# Patient Record
Sex: Female | Born: 1965 | Race: White | Hispanic: No | Smoking: Current every day smoker
Health system: Southern US, Community
[De-identification: ages and names within clinical notes are randomized; demographics above are authoritative.]

## PROBLEM LIST (undated history)

## (undated) DIAGNOSIS — F32A Depression, unspecified: Secondary | ICD-10-CM

## (undated) DIAGNOSIS — F329 Major depressive disorder, single episode, unspecified: Secondary | ICD-10-CM

## (undated) HISTORY — DX: Depression, unspecified: F32.A

## (undated) HISTORY — DX: Major depressive disorder, single episode, unspecified: F32.9

---

## 2007-12-22 ENCOUNTER — Emergency Department (HOSPITAL_COMMUNITY): Admission: EM | Admit: 2007-12-22 | Discharge: 2007-12-22 | Payer: Self-pay | Admitting: Emergency Medicine

## 2008-05-02 ENCOUNTER — Ambulatory Visit: Payer: Self-pay | Admitting: Cardiology

## 2010-09-26 ENCOUNTER — Ambulatory Visit (HOSPITAL_COMMUNITY): Payer: Self-pay | Admitting: Psychiatry

## 2010-10-08 ENCOUNTER — Ambulatory Visit (HOSPITAL_COMMUNITY): Payer: Self-pay | Admitting: Psychiatry

## 2010-11-01 ENCOUNTER — Ambulatory Visit (HOSPITAL_COMMUNITY): Payer: Self-pay | Admitting: Psychiatry

## 2010-11-22 ENCOUNTER — Ambulatory Visit (HOSPITAL_COMMUNITY): Payer: Self-pay | Admitting: Psychiatry

## 2010-11-28 ENCOUNTER — Ambulatory Visit (HOSPITAL_COMMUNITY): Admit: 2010-11-28 | Payer: Self-pay | Admitting: Psychiatry

## 2010-12-18 ENCOUNTER — Ambulatory Visit (HOSPITAL_COMMUNITY): Admit: 2010-12-18 | Payer: Self-pay | Admitting: Psychiatry

## 2011-08-15 LAB — DIFFERENTIAL
Eosinophils Absolute: 0.2
Eosinophils Relative: 2
Lymphs Abs: 2.5
Monocytes Relative: 6

## 2011-08-15 LAB — CBC
HCT: 41.3
Hemoglobin: 14
MCHC: 34
MCV: 91.3
Platelets: 328
RBC: 4.51
RDW: 13
WBC: 11.5 — ABNORMAL HIGH

## 2011-08-15 LAB — POCT CARDIAC MARKERS
Operator id: 189501
Troponin i, poc: <0.05

## 2011-08-15 LAB — D-DIMER, QUANTITATIVE

## 2012-12-13 ENCOUNTER — Encounter (HOSPITAL_COMMUNITY): Payer: Self-pay

## 2012-12-13 ENCOUNTER — Inpatient Hospital Stay (HOSPITAL_COMMUNITY)
Admission: EM | Admit: 2012-12-13 | Discharge: 2012-12-16 | DRG: 138 | Disposition: A | Discharge status: Home / Self Care | Payer: MEDICAID | Attending: Internal Medicine | Admitting: Internal Medicine

## 2012-12-13 ENCOUNTER — Emergency Department (HOSPITAL_COMMUNITY): Payer: Self-pay

## 2012-12-13 DIAGNOSIS — R52 Pain, unspecified: Secondary | ICD-10-CM | POA: Diagnosis present

## 2012-12-13 DIAGNOSIS — F329 Major depressive disorder, single episode, unspecified: Secondary | ICD-10-CM

## 2012-12-13 DIAGNOSIS — F17200 Nicotine dependence, unspecified, uncomplicated: Secondary | ICD-10-CM | POA: Diagnosis present

## 2012-12-13 DIAGNOSIS — J449 Chronic obstructive pulmonary disease, unspecified: Secondary | ICD-10-CM | POA: Diagnosis present

## 2012-12-13 DIAGNOSIS — E876 Hypokalemia: Secondary | ICD-10-CM | POA: Diagnosis not present

## 2012-12-13 DIAGNOSIS — G8929 Other chronic pain: Secondary | ICD-10-CM

## 2012-12-13 DIAGNOSIS — F3289 Other specified depressive episodes: Secondary | ICD-10-CM

## 2012-12-13 DIAGNOSIS — J4489 Other specified chronic obstructive pulmonary disease: Secondary | ICD-10-CM | POA: Diagnosis present

## 2012-12-13 DIAGNOSIS — D649 Anemia, unspecified: Secondary | ICD-10-CM | POA: Diagnosis not present

## 2012-12-13 DIAGNOSIS — K029 Dental caries, unspecified: Secondary | ICD-10-CM

## 2012-12-13 DIAGNOSIS — K047 Periapical abscess without sinus: Secondary | ICD-10-CM

## 2012-12-13 DIAGNOSIS — E860 Dehydration: Secondary | ICD-10-CM

## 2012-12-13 DIAGNOSIS — K122 Cellulitis and abscess of mouth: Principal | ICD-10-CM | POA: Diagnosis present

## 2012-12-13 LAB — CBC WITH DIFFERENTIAL/PLATELET
Eosinophils Relative: 2 % (ref 0–5)
Lymphocytes Relative: 14 % (ref 12–46)
Lymphs Abs: 1.8 10*3/uL (ref 0.7–4.0)
MCV: 92.6 fL (ref 78.0–100.0)
Neutro Abs: 9 10*3/uL — ABNORMAL HIGH (ref 1.7–7.7)
Platelets: 347 10*3/uL (ref 150–400)
RBC: 4.19 MIL/uL (ref 3.87–5.11)
WBC: 12.4 10*3/uL — ABNORMAL HIGH (ref 4.0–10.5)

## 2012-12-13 LAB — BASIC METABOLIC PANEL
CO2: 26 mEq/L (ref 19–32)
Calcium: 8.8 mg/dL (ref 8.4–10.5)
Chloride: 99 mEq/L (ref 96–112)
Sodium: 133 mEq/L — ABNORMAL LOW (ref 135–145)

## 2012-12-13 MED ORDER — OXYCODONE HCL 5 MG PO TABS
5.0000 mg | ORAL_TABLET | ORAL | Status: DC | PRN
  Administered 2012-12-14 – 2012-12-15 (×2): 5 mg via ORAL
  Filled 2012-12-13 (×2): qty 1

## 2012-12-13 MED ORDER — ONDANSETRON HCL 4 MG PO TABS: 4.0000 mg | ORAL_TABLET | Freq: Four times a day (QID) | ORAL | Status: DC | PRN

## 2012-12-13 MED ORDER — HYDROMORPHONE HCL PF 1 MG/ML IJ SOLN
1.0000 mg | INTRAMUSCULAR | Status: DC | PRN
  Administered 2012-12-13 – 2012-12-14 (×6): 1 mg via INTRAVENOUS
  Administered 2012-12-15: 2 mg via INTRAVENOUS
  Administered 2012-12-15 (×4): 1 mg via INTRAVENOUS
  Administered 2012-12-16: 2 mg via INTRAVENOUS
  Administered 2012-12-16: 1 mg via INTRAVENOUS
  Administered 2012-12-16: 2 mg via INTRAVENOUS
  Filled 2012-12-13 (×3): qty 1
  Filled 2012-12-13 (×2): qty 2
  Filled 2012-12-13 (×4): qty 1
  Filled 2012-12-13: qty 2
  Filled 2012-12-13 (×4): qty 1

## 2012-12-13 MED ORDER — HYDROMORPHONE HCL PF 1 MG/ML IJ SOLN
1.0000 mg | Freq: Once | INTRAMUSCULAR | Status: AC
  Administered 2012-12-13: 1 mg via INTRAVENOUS
  Filled 2012-12-13: qty 1

## 2012-12-13 MED ORDER — ALUM & MAG HYDROXIDE-SIMETH 200-200-20 MG/5ML PO SUSP: 30.0000 mL | Freq: Four times a day (QID) | ORAL | Status: DC | PRN

## 2012-12-13 MED ORDER — ONDANSETRON HCL 4 MG/2ML IJ SOLN
4.0000 mg | Freq: Four times a day (QID) | INTRAMUSCULAR | Status: DC | PRN
  Administered 2012-12-15: 4 mg via INTRAVENOUS
  Filled 2012-12-13: qty 2

## 2012-12-13 MED ORDER — VANCOMYCIN HCL 1000 MG IV SOLR
750.0000 mg | Freq: Two times a day (BID) | INTRAVENOUS | Status: DC
  Administered 2012-12-14 – 2012-12-15 (×3): 750 mg via INTRAVENOUS
  Filled 2012-12-13 (×4): qty 750

## 2012-12-13 MED ORDER — CLINDAMYCIN PHOSPHATE 600 MG/50ML IV SOLN
600.0000 mg | Freq: Once | INTRAVENOUS | Status: AC
  Administered 2012-12-13: 600 mg via INTRAVENOUS
  Filled 2012-12-13: qty 50

## 2012-12-13 MED ORDER — CITALOPRAM HYDROBROMIDE 40 MG PO TABS
40.0000 mg | ORAL_TABLET | Freq: Every day | ORAL | Status: DC
  Administered 2012-12-15 – 2012-12-16 (×2): 40 mg via ORAL
  Filled 2012-12-13 (×4): qty 1

## 2012-12-13 MED ORDER — SODIUM CHLORIDE 0.9 % IV SOLN
3.0000 g | Freq: Once | INTRAVENOUS | Status: AC
  Administered 2012-12-13: 3 g via INTRAVENOUS
  Filled 2012-12-13: qty 3

## 2012-12-13 MED ORDER — SODIUM CHLORIDE 0.9 % IV SOLN
INTRAVENOUS | Status: DC
  Administered 2012-12-13 – 2012-12-14 (×2): via INTRAVENOUS
  Administered 2012-12-14: 100 mL/h via INTRAVENOUS
  Administered 2012-12-15: 1000 mL via INTRAVENOUS

## 2012-12-13 MED ORDER — SODIUM CHLORIDE 0.9 % IV BOLUS (SEPSIS)
1000.0000 mL | Freq: Once | INTRAVENOUS | Status: AC
  Administered 2012-12-13: 1000 mL via INTRAVENOUS

## 2012-12-13 MED ORDER — ALPRAZOLAM 0.5 MG PO TABS
1.0000 mg | ORAL_TABLET | Freq: Three times a day (TID) | ORAL | Status: DC | PRN
  Administered 2012-12-14 – 2012-12-16 (×3): 1 mg via ORAL
  Filled 2012-12-13 (×2): qty 2
  Filled 2012-12-13 (×2): qty 1

## 2012-12-13 MED ORDER — IOHEXOL 300 MG/ML  SOLN
80.0000 mL | Freq: Once | INTRAMUSCULAR | Status: AC | PRN
  Administered 2012-12-13: 80 mL via INTRAVENOUS

## 2012-12-13 MED ORDER — HYDROMORPHONE HCL PF 1 MG/ML IJ SOLN
1.0000 mg | Freq: Once | INTRAMUSCULAR | Status: DC
  Filled 2012-12-13: qty 1

## 2012-12-13 MED ORDER — ACETAMINOPHEN 650 MG RE SUPP: 650.0000 mg | Freq: Four times a day (QID) | RECTAL | Status: DC | PRN

## 2012-12-13 MED ORDER — ACETAMINOPHEN 325 MG PO TABS
650.0000 mg | ORAL_TABLET | Freq: Four times a day (QID) | ORAL | Status: DC | PRN
  Filled 2012-12-13: qty 1

## 2012-12-13 MED ORDER — VANCOMYCIN HCL IN DEXTROSE 1-5 GM/200ML-% IV SOLN
1000.0000 mg | Freq: Once | INTRAVENOUS | Status: AC
  Administered 2012-12-13: 1000 mg via INTRAVENOUS
  Filled 2012-12-13 (×2): qty 200

## 2012-12-13 MED ORDER — SODIUM CHLORIDE 0.9 % IV SOLN
3.0000 g | Freq: Four times a day (QID) | INTRAVENOUS | Status: DC
  Administered 2012-12-13 – 2012-12-16 (×11): 3 g via INTRAVENOUS
  Filled 2012-12-13 (×14): qty 3

## 2012-12-13 MED ORDER — ONDANSETRON HCL 4 MG/2ML IJ SOLN
4.0000 mg | Freq: Once | INTRAMUSCULAR | Status: AC
  Administered 2012-12-13: 4 mg via INTRAVENOUS
  Filled 2012-12-13: qty 2

## 2012-12-13 NOTE — H&P (Signed)
History and Physical       Hospital Admission Note Date: 12/13/2012  Patient name: Krista Thomas Medical record number: [id number] Date of birth: 11/15/1966 Age: 47 y.o. Gender: female PCP: Selinda Flavin, MD    Chief Complaint:  Right sided dental pain with facial swelling worsening for the last 4 days   HPI: Patient is a 47 year old female with no significant past medical history presented to [facility] ED with right-sided dental pain for the last several weeks. Patient had just started a new job hence did not seek dental care right away. She noticed that her right side of the face was was progressively getting swollen and hard dental pain was worse in the last 2 days. She went to her family physician who prescribed her antibiotics which did not help. She also reported associated headache, neck pain on the right side, difficulty eating and difficulty sleeping due to pain but no significant fevers or chills. She then subsequently went to ER in [place] and was prescribed oral clindamycin and Vicodin for the pain. Patient states that her symptoms did not improve hence he went to the [facility] ED for further workup. In [facility] ED, ed physician called ENT, Dr. Lazarus Salines who reviewed the CT imaging and recommended IV antibiotics and oral surgery was not was emergently needed. Patient was subsequently transferred to [facility]. Unfortunately no oral dental surgery coverage is available today.   Review of Systems:  Constitutional: Denies fever, chills, diaphoresis, + patient reports difficulty in eating due to pain .  HEENT: Denies photophobia, eye pain, redness, hearing loss, ear pain, congestion, sore throat, rhinorrhea, sneezing, mouth sores, + complaining of trouble swallowing due to pain and associated right-sided neck pain but no neck stiffness and tinnitus.   Respiratory: Denies SOB, DOE, cough, chest tightness,  and wheezing.     Cardiovascular: Denies chest pain, palpitations and leg swelling.  Gastrointestinal: Denies abdominal pain, diarrhea, constipation, blood in stool and abdominal distention. + nausea, vomiting Genitourinary: Denies dysuria, urgency, frequency, hematuria, flank pain and difficulty urinating.  Musculoskeletal: Denies myalgias, back pain, joint swelling, arthralgias and gait problem.  Skin: Denies pallor, rash and wound.  Neurological: Denies dizziness, seizures, syncope, weakness, light-headedness, numbness and headaches.  Hematological: Denies adenopathy. Easy bruising, personal or family bleeding history  Psychiatric/Behavioral: Denies suicidal ideation, mood changes, confusion, nervousness, sleep disturbance and agitation  Past Medical History: History reviewed. No pertinent past medical history. History reviewed. No pertinent past surgical history.  Medications: Prior to Admission medications   Medication Sig Start Date End Date Taking? Authorizing Provider  ALPRAZolam Prudy Feeler) 1 MG tablet Take 1 mg by mouth 3 (three) times daily as needed. anxiety   Yes Historical Provider, MD  citalopram (CELEXA) 40 MG tablet Take 40 mg by mouth daily.   Yes Historical Provider, MD    Allergies:  No Known Allergies  Social History:  reports that she has been smoking.  She does not have any smokeless tobacco history on file. She reports that she does not drink alcohol or use illicit drugs.  Family History: History reviewed. No pertinent family history.  Physical Exam: Blood pressure 104/57, pulse 70, temperature 98 F (36.7 C), temperature source Oral, resp. rate 16, height 5\' 2"  (1.575 m), weight 68.04 kg (150 lb), last menstrual period 12/12/2012, SpO2 97.00%. General: Alert, awake, oriented x3, in no acute distress. HEENT: normocephalic, atraumatic, anicteric sclera, pink conjunctiva, pupils equal and reactive to light and accomodation, oropharynx clear, few dental caries in right lower molars  however patient did not open jaw more for complete exam due to pain   Neck: supple, no masses or lymphadenopathy, no goiter, no bruits  Heart: Regular rate and rhythm, without murmurs, rubs or gallops. Lungs: Clear to auscultation bilaterally, no wheezing, rales or rhonchi. Abdomen: Soft, nontender, nondistended, positive bowel sounds, no masses. Extremities: No clubbing, cyanosis or edema with positive pedal pulses. Neuro: Grossly intact, no focal neurological deficits, strength 5/5 upper and lower extremities bilaterally Psych: alert and oriented x 3, anxious Skin: no rashes or lesions, warm and dry   LABS on Admission:  Basic Metabolic Panel:  Lab 12/13/12 9147  NA 133*  K 4.0  CL 99  CO2 26  GLUCOSE 86  BUN 9  CREATININE 0.82  CALCIUM 8.8  MG --  PHOS --   CBC:  Lab 12/13/12 0713  WBC 12.4*  NEUTROABS 9.0*  HGB 13.4  HCT 38.8  MCV 92.6  PLT 347     Radiological Exams on Admission: Ct Maxillofacial W/cm  12/13/2012  *RADIOLOGY REPORT*  Clinical Data: Right-sided facial pain and swelling for few weeks. Dental pain.  CT MAXILLOFACIAL WITH CONTRAST  Technique:  Multidetector CT imaging of the maxillofacial structures was performed with intravenous contrast. Multiplanar CT image reconstructions were also generated.  Contrast: 80mL OMNIPAQUE IOHEXOL 300 MG/ML  SOLN  Comparison: None.  Findings: Soft tissue windows demonstrate normal limited intracranial imaging.  Normal orbits and globes.  Mildly prominent right submandibular nodes which are likely reactive.  Subcutaneous edema centered about the right side of the mandible with overlying skin thickening.  A fluid collection positioned deep to the right side of the mandible which measures 2.9 x 1.4 cm on image 59/series 2 and has peripheral enhancement.  2.0 cm cranial caudal on coronal image 39/series 4.  All vascular structures enhance normally.  Normal submandibular and parotid glands.  Bone windows demonstrate clear paranasal  sinuses and mastoid air cells. Lucency surrounding the root of a right mandibular tooth on sagittal image 28/series 7 is suspicious for peri apical abscess, likely the source of the submandibular fluid collection.  IMPRESSION: Right mandibular dental infection with adjacent subperiosteal abscess in the right floor of mouth.  Adjacent lymph nodes which are likely reactive.   Original Report Authenticated By: Jeronimo Greaves, M.D.     Assessment/Plan Principal Problem:  *Dental abscess - place on IV vancomycin, unasyn (failed clindamycin out-patient, however she had just started clindamycin yesterday), obtain blood cultures - IV and oral pain medications, zofran,  - Oral-Dental coverage is unfortunately not available today. Will consult oral surgery tomorrow AM.   Active Problems:  Dehydration: cont IV fluid hydration   Intractable pain - Placed on IV Dilaudid and oxycodone PRN   Depression/anxiety: - Placed on Celexa and PRN xanax   DVT prophylaxis: SCD's  CODE STATUS: FC  Further plan will depend as patient's clinical course evolves and further radiologic and laboratory data become available.   Time Spent on Admission: 1 hour  RAI,RIPUDEEP M.D. [facility] 12/13/2012, 6:23 PM Pager: [phone]  If 7PM-7AM, please contact night-coverage [email or web address] Password [email or web address]

## 2012-12-13 NOTE — ED Notes (Signed)
Pt/Pt family updated that EMS will be taking pt to [facility] [place]. Pt/Pt family verbalized understanding. Receiving RN at [facility] aware.

## 2012-12-13 NOTE — ED Notes (Addendum)
[facility] was diverted to a higher acuity call and will be delayed picking pt up. Pt and Pt family updated on care plan and verbalized understanding. MD aware. No new orders given at this time.

## 2012-12-13 NOTE — ED Notes (Signed)
Pt reports a bad taste in her mouth and spitting up blood. Small amounts noted in emesis basin. MD aware and given no new orders at this time. nad noted.

## 2012-12-13 NOTE — Progress Notes (Signed)
ANTIBIOTIC CONSULT NOTE - INITIAL  Pharmacy Consult for Vanco/Unasyn Indication: dental abscess  No Known Allergies  Patient Measurements: Height: 5\' 2"  (157.5 cm) Weight: 150 lb (68.04 kg) IBW/kg (Calculated) : 50.1  Adjusted Body Weight:    Vital Signs: Temp: 98 F (36.7 C) (01/19 0703) Temp src: Oral (01/19 0703) BP: 104/57 mmHg (01/19 1531) Pulse Rate: 70  (01/19 1531) Intake/Output from previous day:   Intake/Output from this shift:    Labs:  [facility] 12/13/12 0713  WBC 12.4*  HGB 13.4  PLT 347  LABCREA --  CREATININE 0.82   Estimated Creatinine Clearance: 78.4 ml/min (by C-G formula based on Cr of 0.82). No results found for this basename: VANCOTROUGH:2,VANCOPEAK:2,VANCORANDOM:2,GENTTROUGH:2,GENTPEAK:2,GENTRANDOM:2,TOBRATROUGH:2,TOBRAPEAK:2,TOBRARND:2,AMIKACINPEAK:2,AMIKACINTROU:2,AMIKACIN:2, in the last 72 hours   Microbiology: No results found for this or any previous visit (from the past 720 hour(s)).  Medical History: History reviewed. No pertinent past medical history.  Medications:  Prescriptions prior to admission  Medication Sig Dispense Refill  . ALPRAZolam (XANAX) 1 MG tablet Take 1 mg by mouth 3 (three) times daily as needed. anxiety      . citalopram (CELEXA) 40 MG tablet Take 40 mg by mouth daily.       Assessment: Dental pain Dental pain with R-sided facial swelling. Previously treated with PCN and Clinda for only about 48 hrs? WBC 12.4. Scr 0.82.   Goal of Therapy:  Vancomycin trough level 10-15 mcg/ml  Plan:  Unasyn 3g IV q6hrs. Vanco 1g IV x1 then 750mg  IV q12 hrs. Recommend d/c Vanco and continue Unasyn alone + dental consult.  Misty Stanley Stillinger 12/13/2012,6:25 PM

## 2012-12-13 NOTE — ED Notes (Signed)
Report called to [facility]. Receiving nurse,Joanna,RN, updated on delay of [facility] and pt status.

## 2012-12-13 NOTE — ED Provider Notes (Signed)
History  This chart was scribed for Joya Gaskins, MD by Erskine Emery, ED Scribe. This patient was seen in room APA07/[facility]07 and the patient's care was started at 06:55.   CSN: [id number]  Arrival date & time 12/13/12  4742   First MD Initiated Contact with Patient 12/13/12 [phone]      Chief Complaint  Patient presents with  . Dental Pain     The history is provided by the patient. No language interpreter was used.  Krista Thomas is a 47 y.o. female who presents to the Emergency Department complaining of dental pain for the last several weeks and right-sided facial swelling since Thursday (4 days ago). Pt reports some associated neck pain, headache, difficulty eating, sleep disturbance, and coughing but denies any fevers, emesis, vision changes, or swelling of the lower extremities. Pt went to her PCP 2 days ago for the same complaint and was given penicilin which did nothing to relieve her symptoms. She then went to the ED in [facility] yesterday, where they gave her a stronger antibiotic (clindamycin) and hydrocodone that still did not relieve her symptoms. Pt has no other health problems. She has NKDA. She reports her course is worsening and nothing improves her symptoms   PMH - none  No past surgical history on file.  No family history on file.  History  Substance Use Topics  . Smoking status: Not on file  . Smokeless tobacco: Not on file  . Alcohol Use: Not on file    OB History    No data available      Review of Systems  Constitutional: Negative for fever and chills.  HENT: Positive for facial swelling, trouble swallowing, neck pain and dental problem.   Eyes: Negative for visual disturbance.  Respiratory: Positive for cough. Negative for shortness of breath.   Gastrointestinal: Negative for nausea and vomiting.  Genitourinary: Negative for dysuria.  Musculoskeletal: Negative for back pain and joint swelling.  Skin: Negative for rash.  Neurological: Positive for  headaches. Negative for weakness.  Psychiatric/Behavioral: Positive for sleep disturbance.  All other systems reviewed and are negative.    Allergies  Review of patient's allergies indicates not on file.  Home Medications  No current outpatient prescriptions on file.  Triage Vitals: BP 117/69  Pulse 95  Temp 98 F (36.7 C) (Oral)  Resp 18  Ht 5\' 2"  (1.575 m)  Wt 151 lb (68.493 kg)  BMI 27.62 kg/m2  SpO2 98%  Physical Exam CONSTITUTIONAL: Well developed/well nourished HEAD AND FACE: Normocephalic/atraumatic. Right-sided facial swelling that extends under the jaw line. Mild trismus. No intra oral abscess. No overlying external erythema noted EYES: EOMI/PERRL, no proptosis ENMT: Mucous membranes moist, no stridor NECK: supple no meningeal signs.  SPINE:entire spine nontender CV: S1/S2 noted, no murmurs/rubs/gallops noted LUNGS: Lungs are clear to auscultation bilaterally, no apparent distress ABDOMEN: soft, nontender, no rebound or guarding GU:no cva tenderness NEURO: Pt is awake/alert, moves all extremitiesx4 EXTREMITIES: pulses normal, full ROM SKIN: warm, color normal PSYCH: no abnormalities of mood noted   ED Course  Procedures  DIAGNOSTIC STUDIES: Oxygen Saturation is 98% on room air, normal by my interpretation.    COORDINATION OF CARE: 07:10--I evaluated the patient and we discussed a treatment plan including CAT scan of the neck, IV with pain medicine and antibiotics, and possible surgery today (at [facility] or [place]) to which the pt agreed.   09:36--09:40--Consult. D/w dr Oswaldo Done  - on call for dentistry, he requests I call oral surgery  I spoke to radiology about this CT - there is no airway compromise per CT D/w dr ghimire for [facility] - he requests I call ENT as no oral surgery coverage today [facility] to call ENT 10:55 AM D/w dr Lazarus Salines (ENT) who reviewed CT imaging.  He recommends oral surgery but does not require it emergently.  Would benefit from  several doses of IV antibiotics 10:57 AM D/w dr ghimire who will accept in [facility] to [facility].     Results for orders placed during the hospital encounter of 12/13/12  BASIC METABOLIC PANEL      Component Value Range   Sodium 133 (*) 135 - 145 mEq/L   Potassium 4.0  3.5 - 5.1 mEq/L   Chloride 99  96 - 112 mEq/L   CO2 26  19 - 32 mEq/L   Glucose, Bld 86  70 - 99 mg/dL   BUN 9  6 - 23 mg/dL   Creatinine, Ser 9.14  0.50 - 1.10 mg/dL   Calcium 8.8  8.4 - 78.2 mg/dL   GFR calc non Af Amer 85 (*) >90 mL/min   GFR calc Af Amer >90  >90 mL/min  CBC WITH DIFFERENTIAL      Component Value Range   WBC 12.4 (*) 4.0 - 10.5 K/uL   RBC 4.19  3.87 - 5.11 MIL/uL   Hemoglobin 13.4  12.0 - 15.0 g/dL   HCT 95.6  21.3 - 08.6 %   MCV 92.6  78.0 - 100.0 fL   MCH 32.0  26.0 - 34.0 pg   MCHC 34.5  30.0 - 36.0 g/dL   RDW 57.8  46.9 - 62.9 %   Platelets 347  150 - 400 K/uL   Neutrophils Relative 73  43 - 77 %   Neutro Abs 9.0 (*) 1.7 - 7.7 K/uL   Lymphocytes Relative 14  12 - 46 %   Lymphs Abs 1.8  0.7 - 4.0 K/uL   Monocytes Relative 11  3 - 12 %   Monocytes Absolute 1.3 (*) 0.1 - 1.0 K/uL   Eosinophils Relative 2  0 - 5 %   Eosinophils Absolute 0.3  0.0 - 0.7 K/uL   Basophils Relative 0  0 - 1 %   Basophils Absolute 0.0  0.0 - 0.1 K/uL   Ct Maxillofacial W/cm  12/13/2012  *RADIOLOGY REPORT*  Clinical Data: Right-sided facial pain and swelling for few weeks. Dental pain.  CT MAXILLOFACIAL WITH CONTRAST  Technique:  Multidetector CT imaging of the maxillofacial structures was performed with intravenous contrast. Multiplanar CT image reconstructions were also generated.  Contrast: 80mL OMNIPAQUE IOHEXOL 300 MG/ML  SOLN  Comparison: None.  Findings: Soft tissue windows demonstrate normal limited intracranial imaging.  Normal orbits and globes.  Mildly prominent right submandibular nodes which are likely reactive.  Subcutaneous edema centered about the right side of the mandible with overlying skin  thickening.  A fluid collection positioned deep to the right side of the mandible which measures 2.9 x 1.4 cm on image 59/series 2 and has peripheral enhancement.  2.0 cm cranial caudal on coronal image 39/series 4.  All vascular structures enhance normally.  Normal submandibular and parotid glands.  Bone windows demonstrate clear paranasal sinuses and mastoid air cells. Lucency surrounding the root of a right mandibular tooth on sagittal image 28/series 7 is suspicious for peri apical abscess, likely the source of the submandibular fluid collection.  IMPRESSION: Right mandibular dental infection with adjacent subperiosteal abscess in the right floor of mouth.  Adjacent lymph nodes which are likely reactive.   Original Report Authenticated By: Jeronimo Greaves, M.D.         MDM  Nursing notes including past medical history and social history reviewed and considered in documentation Labs/vital reviewed and considered       I personally performed the services described in this documentation, which was scribed in my presence. The recorded information has been reviewed and is accurate.      Joya Gaskins, MD 12/13/12 1058

## 2012-12-13 NOTE — ED Notes (Signed)
Complain of dental pain and swelling  

## 2012-12-13 NOTE — ED Notes (Signed)
Pt states she has been to her dentist and to another ED. Currently on pen vk, clindamycin and hydrocodone. States they are not helping

## 2012-12-14 ENCOUNTER — Observation Stay (HOSPITAL_COMMUNITY): Payer: Self-pay

## 2012-12-14 LAB — BASIC METABOLIC PANEL
GFR calc non Af Amer: >90 mL/min (ref >90)
Glucose, Bld: 85 mg/dL (ref 70–99)
Potassium: 3.8 mEq/L (ref 3.5–5.1)
Sodium: 138 mEq/L (ref 135–145)

## 2012-12-14 LAB — CBC
Hemoglobin: 11.4 g/dL — ABNORMAL LOW (ref 12.0–15.0)
MCH: 31.1 pg (ref 26.0–34.0)
RBC: 3.66 MIL/uL — ABNORMAL LOW (ref 3.87–5.11)

## 2012-12-14 NOTE — Consult Note (Signed)
Oral & Maxillofacial Surgery Consultation  Reason for Consult:Right Facial Swelling Referring Physician: Internal Medicine   Krista Thomas is an 47 y.o. female.  HPI: Patient reports that she developed facial swelling last Thursday and she went to her PCP.  She was prescribed Penicillin VK and was sent home.  She then went to the ER/Urgent Care in [place] and was seen and released.  She then went to [facility] and was transferred to [facility] for evaluation of Odontogenic Abscess.  CT scan shows the Submandibular and Pterygomandibular Spaces having fluid collections.   PMHx: History reviewed. No pertinent past medical history.  PSx: History reviewed. No pertinent past surgical history.  Family Hx: History reviewed. No pertinent family history.  Social Hx:  reports that she has been smoking.  She does not have any smokeless tobacco history on file. She reports that she does not drink alcohol or use illicit drugs.  Allergies: No Known Allergies  Medications: I have reviewed the patient's current medications.  Labs:  Results for orders placed during the hospital encounter of 12/13/12 (from the past 48 hour(s))  BASIC METABOLIC PANEL     Status: Abnormal   Collection Time   12/13/12  7:13 AM      Component Value Range Comment   Sodium 133 (*) 135 - 145 mEq/Thomas    Potassium 4.0  3.5 - 5.1 mEq/Thomas    Chloride 99  96 - 112 mEq/Thomas    CO2 26  19 - 32 mEq/Thomas    Glucose, Bld 86  70 - 99 mg/dL    BUN 9  6 - 23 mg/dL    Creatinine, Ser 1.61  0.50 - 1.10 mg/dL    Calcium 8.8  8.4 - 09.6 mg/dL    GFR calc non Af Amer 85 (*) >90 mL/min    GFR calc Af Amer >90  >90 mL/min   CBC WITH DIFFERENTIAL     Status: Abnormal   Collection Time   12/13/12  7:13 AM      Component Value Range Comment   WBC 12.4 (*) 4.0 - 10.5 K/uL    RBC 4.19  3.87 - 5.11 MIL/uL    Hemoglobin 13.4  12.0 - 15.0 g/dL    HCT 04.5  40.9 - 81.1 %    MCV 92.6  78.0 - 100.0 fL    MCH 32.0  26.0 - 34.0 pg    MCHC 34.5  30.0 - 36.0  g/dL    RDW 91.4  78.2 - 95.6 %    Platelets 347  150 - 400 K/uL    Neutrophils Relative 73  43 - 77 %    Neutro Abs 9.0 (*) 1.7 - 7.7 K/uL    Lymphocytes Relative 14  12 - 46 %    Lymphs Abs 1.8  0.7 - 4.0 K/uL    Monocytes Relative 11  3 - 12 %    Monocytes Absolute 1.3 (*) 0.1 - 1.0 K/uL    Eosinophils Relative 2  0 - 5 %    Eosinophils Absolute 0.3  0.0 - 0.7 K/uL    Basophils Relative 0  0 - 1 %    Basophils Absolute 0.0  0.0 - 0.1 K/uL   BASIC METABOLIC PANEL     Status: Abnormal   Collection Time   12/14/12  7:09 AM      Component Value Range Comment   Sodium 138  135 - 145 mEq/Thomas    Potassium 3.8  3.5 - 5.1  mEq/Thomas    Chloride 104  96 - 112 mEq/Thomas    CO2 23  19 - 32 mEq/Thomas    Glucose, Bld 85  70 - 99 mg/dL    BUN 6  6 - 23 mg/dL    Creatinine, Ser 4.78  0.50 - 1.10 mg/dL    Calcium 8.1 (*) 8.4 - 10.5 mg/dL    GFR calc non Af Amer >90  >90 mL/min    GFR calc Af Amer >90  >90 mL/min   CBC     Status: Abnormal   Collection Time   12/14/12  7:09 AM      Component Value Range Comment   WBC 7.3  4.0 - 10.5 K/uL    RBC 3.66 (*) 3.87 - 5.11 MIL/uL    Hemoglobin 11.4 (*) 12.0 - 15.0 g/dL    HCT 29.5 (*) 62.1 - 46.0 %    MCV 92.3  78.0 - 100.0 fL    MCH 31.1  26.0 - 34.0 pg    MCHC 33.7  30.0 - 36.0 g/dL    RDW 30.8  65.7 - 84.6 %    Platelets 305  150 - 400 K/uL     Radiology: Dg Orthopantogram  12/14/2012  *RADIOLOGY REPORT*  Clinical Data: Dental abscess, right jaw pain  ORTHOPANTOGRAM/PANORAMIC  Comparison:  CT 12/13/2012  Findings: Multiple missing teeth and restorations.  There is lucency around the roots of tooth number 31.  Blurring limits evaluation of incisors.  The mandible appears intact.  IMPRESSION:  Periapical lucency consistent with abscess, tooth 31.  Recommend dental films for complete evaluation.   Original Report Authenticated By: D. Andria Rhein, MD    Ct Maxillofacial W/cm  12/13/2012  *RADIOLOGY REPORT*  Clinical Data: Right-sided facial pain and swelling  for few weeks. Dental pain.  CT MAXILLOFACIAL WITH CONTRAST  Technique:  Multidetector CT imaging of the maxillofacial structures was performed with intravenous contrast. Multiplanar CT image reconstructions were also generated.  Contrast: 80mL OMNIPAQUE IOHEXOL 300 MG/ML  SOLN  Comparison: None.  Findings: Soft tissue windows demonstrate normal limited intracranial imaging.  Normal orbits and globes.  Mildly prominent right submandibular nodes which are likely reactive.  Subcutaneous edema centered about the right side of the mandible with overlying skin thickening.  A fluid collection positioned deep to the right side of the mandible which measures 2.9 x 1.4 cm on image 59/series 2 and has peripheral enhancement.  2.0 cm cranial caudal on coronal image 39/series 4.  All vascular structures enhance normally.  Normal submandibular and parotid glands.  Bone windows demonstrate clear paranasal sinuses and mastoid air cells. Lucency surrounding the root of a right mandibular tooth on sagittal image 28/series 7 is suspicious for peri apical abscess, likely the source of the submandibular fluid collection.  IMPRESSION: Right mandibular dental infection with adjacent subperiosteal abscess in the right floor of mouth.  Adjacent lymph nodes which are likely reactive.   Original Report Authenticated By: Jeronimo Greaves, M.D.     ROS:A comprehensive review of systems was negative except for: Ears, nose, mouth, throat, and face: positive for sore mouth, sore throat and limited opening.  Vital Signs: BP 102/60  Pulse 75  Temp 98.1 F (36.7 C) (Oral)  Resp 18  Ht 5\' 2"  (1.575 m)  Wt 68.04 kg (150 lb)  BMI 27.44 kg/m2  SpO2 97%  LMP 12/12/2012  Physical Exam: General appearance: alert and cooperative Throat: abnormal findings: dentition: multiple carries, moderate oropharyngeal erythema and extraoral and intraoral  right mandibular edema, trismus (MVO ~25mm), pharyngeal edema, patient able to protrude her  tongue. Neck: mild anterior cervical adenopathy, no carotid bruit, no JVD, supple, symmetrical, trachea midline, thyroid not enlarged, symmetric, no tenderness/mass/nodules and No erythema in the right mandible or right neck.   The patient had loss of the right inferior border upon palpation. The patient can handle her own secretions.  No dysphonia or dysphasia.  She does have odynophagia.    Assessment/Plan: Right Submandibular and Pterygomandibular space infection, Carious and Periapical abscess associated with #31, Tooth #30 also has a carious lesion.  1. Patient currently eating, will need to take her for I&D of right fascial spaces (Submandibular and Pterygomandibular) and extraction of #31 and other necessary teeth.   2. Explained that the patient needs to follow up with a general dentist for restoration of her other teeth.  3. NPO after midnight 4. Will post case for tomorrow as patient just finished eating. 5. Continue current IV antibiotics   Realitos,Krista Thomas  12/14/2012, 2:03 PM

## 2012-12-14 NOTE — Progress Notes (Signed)
[facility] PROGRESS NOTE  Krista Thomas ZOX:[id number] DOB: [birth year] DOA: 12/13/2012 PCP: Selinda Flavin, MD  Assessment/Plan: 1. Right submandibular space infection. Currently on Vancomycin and Unasyn and doing well. Surgery was consulted and plan to OR in the morning, I appreciate their help. Panore[employer] films today. NPO past midnight.   Code Status: Full Family Communication: mother  Disposition Plan: pending OR.   Consultants:  Oral surgery  Procedures:  none  Antibiotics:  Unasyn 1/19>>  Vancomycin 1/19>>   HPI/Subjective: NAD this morning, still complains of pain however feels a bit improved.   Objective: Filed Vitals:   12/13/12 1531 12/13/12 2207 12/14/12 0548 12/14/12 1434  BP: 104/57 84/46 102/60 101/59  Pulse: 70 67 75 73  Temp:  98 F (36.7 C) 98.1 F (36.7 C) 98.1 F (36.7 C)  TempSrc:  Oral Oral   Resp: 16 16 18 20   Height: 5\' 2"  (1.575 m)     Weight: 68.04 kg (150 lb)     SpO2: 97% 98% 97% 93%    Intake/Output Summary (Last 24 hours) at 12/14/12 1534 Last data filed at 12/14/12 1400  Gross per 24 hour  Intake    950 ml  Output      0 ml  Net    950 ml   Filed Weights   12/13/12 0703 12/13/12 1531  Weight: 68.493 kg (151 lb) 68.04 kg (150 lb)    Exam:   General:  NAD  HEENT: right mandibular edema, swelling and tenderness, trismus present thus complete oral exam difficult.   Cardiovascular: RRR. No MRG  Respiratory: CTA BIL  Abdomen: soft, NTTP  Data Reviewed: Basic Metabolic Panel:  Lab 12/14/12 8119 12/13/12 0713  NA 138 133*  K 3.8 4.0  CL 104 99  CO2 23 26  GLUCOSE 85 86  BUN 6 9  CREATININE 0.62 0.82  CALCIUM 8.1* 8.8  MG -- --  PHOS -- --   CBC:  Lab 12/14/12 0709 12/13/12 0713  WBC 7.3 12.4*  NEUTROABS -- 9.0*  HGB 11.4* 13.4  HCT 33.8* 38.8  MCV 92.3 92.6  PLT 305 347   Studies: Dg Orthopantogram  12/14/2012  *RADIOLOGY REPORT*  Clinical Data: Dental abscess, right jaw pain   ORTHOPANTOGRAM/PANORAMIC  Comparison:  CT 12/13/2012  Findings: Multiple missing teeth and restorations.  There is lucency around the roots of tooth number 31.  Blurring limits evaluation of incisors.  The mandible appears intact.  IMPRESSION:  Periapical lucency consistent with abscess, tooth 31.  Recommend dental films for complete evaluation.   Original Report Authenticated By: D. Andria Rhein, MD    Ct Maxillofacial W/cm  12/13/2012  *RADIOLOGY REPORT*  Clinical Data: Right-sided facial pain and swelling for few weeks. Dental pain.  CT MAXILLOFACIAL WITH CONTRAST  Technique:  Multidetector CT imaging of the maxillofacial structures was performed with intravenous contrast. Multiplanar CT image reconstructions were also generated.  Contrast: 80mL OMNIPAQUE IOHEXOL 300 MG/ML  SOLN  Comparison: None.  Findings: Soft tissue windows demonstrate normal limited intracranial imaging.  Normal orbits and globes.  Mildly prominent right submandibular nodes which are likely reactive.  Subcutaneous edema centered about the right side of the mandible with overlying skin thickening.  A fluid collection positioned deep to the right side of the mandible which measures 2.9 x 1.4 cm on image 59/series 2 and has peripheral enhancement.  2.0 cm cranial caudal on coronal image 39/series 4.  All vascular structures enhance normally.  Normal submandibular and parotid glands.  Bone windows  demonstrate clear paranasal sinuses and mastoid air cells. Lucency surrounding the root of a right mandibular tooth on sagittal image 28/series 7 is suspicious for peri apical abscess, likely the source of the submandibular fluid collection.  IMPRESSION: Right mandibular dental infection with adjacent subperiosteal abscess in the right floor of mouth.  Adjacent lymph nodes which are likely reactive.   Original Report Authenticated By: Jeronimo Greaves, M.D.     Scheduled Meds:   . ampicillin-sulbactam (UNASYN) IV  3 g Intravenous Q6H  . citalopram   40 mg Oral Daily  . vancomycin  750 mg Intravenous Q12H   Continuous Infusions:   . sodium chloride 100 mL/hr at 12/14/12 4540    Principal Problem:  *Dental abscess Active Problems:  Dehydration  Intractable pain   Pamella Pert  [facility] Pager [phone]. If 8PM-8AM, please contact night-coverage at [email or web address], password [facility] 12/14/2012, 3:34 PM  LOS: 1 day

## 2012-12-15 ENCOUNTER — Encounter (HOSPITAL_COMMUNITY)
Admission: EM | Disposition: A | Discharge status: Home / Self Care | Payer: Self-pay | Source: Home / Self Care | Attending: Internal Medicine

## 2012-12-15 ENCOUNTER — Inpatient Hospital Stay (HOSPITAL_COMMUNITY): Payer: Self-pay | Admitting: Anesthesiology

## 2012-12-15 ENCOUNTER — Encounter (HOSPITAL_COMMUNITY): Payer: Self-pay | Admitting: Anesthesiology

## 2012-12-15 DIAGNOSIS — K029 Dental caries, unspecified: Secondary | ICD-10-CM | POA: Diagnosis present

## 2012-12-15 HISTORY — PX: TOOTH EXTRACTION: SHX859

## 2012-12-15 HISTORY — PX: DEBRIDEMENT MANDIBLE: SHX5308

## 2012-12-15 LAB — SURGICAL PCR SCREEN: Staphylococcus aureus: NEGATIVE

## 2012-12-15 SURGERY — DENTAL RESTORATION/EXTRACTIONS
Anesthesia: General | Site: Mouth | Laterality: Right | Wound class: Dirty or Infected

## 2012-12-15 MED ORDER — SUCCINYLCHOLINE CHLORIDE 20 MG/ML IJ SOLN
INTRAMUSCULAR | Status: DC | PRN
  Administered 2012-12-15: 140 mg via INTRAVENOUS

## 2012-12-15 MED ORDER — MIDAZOLAM HCL 5 MG/5ML IJ SOLN
INTRAMUSCULAR | Status: DC | PRN
  Administered 2012-12-15: 2 mg via INTRAVENOUS

## 2012-12-15 MED ORDER — BUPIVACAINE-EPINEPHRINE PF 0.25-1:200000 % IJ SOLN
INTRAMUSCULAR | Status: DC | PRN
  Administered 2012-12-15: 10 mL

## 2012-12-15 MED ORDER — 0.9 % SODIUM CHLORIDE (POUR BTL) OPTIME
TOPICAL | Status: DC | PRN
  Administered 2012-12-15: 1000 mL

## 2012-12-15 MED ORDER — LIDOCAINE-EPINEPHRINE 2 %-1:100000 IJ SOLN
INTRAMUSCULAR | Status: DC | PRN
  Administered 2012-12-15: 10 mL

## 2012-12-15 MED ORDER — OXYCODONE HCL 5 MG PO TABS: 5.0000 mg | ORAL_TABLET | Freq: Once | ORAL | Status: DC | PRN

## 2012-12-15 MED ORDER — PROPOFOL 10 MG/ML IV BOLUS
INTRAVENOUS | Status: DC | PRN
  Administered 2012-12-15: 200 mg via INTRAVENOUS

## 2012-12-15 MED ORDER — OXYMETAZOLINE HCL 0.05 % NA SOLN
NASAL | Status: DC | PRN
  Administered 2012-12-15: 1 via NASAL

## 2012-12-15 MED ORDER — SUFENTANIL CITRATE 50 MCG/ML IV SOLN
INTRAVENOUS | Status: DC | PRN
  Administered 2012-12-15: 10 ug via INTRAVENOUS

## 2012-12-15 MED ORDER — OXYCODONE HCL 5 MG/5ML PO SOLN: 5.0000 mg | Freq: Once | ORAL | Status: DC | PRN

## 2012-12-15 MED ORDER — ONDANSETRON HCL 4 MG/2ML IJ SOLN
INTRAMUSCULAR | Status: DC | PRN
  Administered 2012-12-15 (×2): 4 mg via INTRAVENOUS

## 2012-12-15 MED ORDER — PROMETHAZINE HCL 25 MG/ML IJ SOLN: 6.2500 mg | INTRAMUSCULAR | Status: DC | PRN

## 2012-12-15 MED ORDER — LACTATED RINGERS IV SOLN
INTRAVENOUS | Status: DC | PRN
  Administered 2012-12-15: 18:00:00 via INTRAVENOUS

## 2012-12-15 MED ORDER — LIDOCAINE HCL (CARDIAC) 20 MG/ML IV SOLN
INTRAVENOUS | Status: DC | PRN
  Administered 2012-12-15: 100 mg via INTRAVENOUS

## 2012-12-15 MED ORDER — HYDROMORPHONE HCL PF 1 MG/ML IJ SOLN: 0.2500 mg | INTRAMUSCULAR | Status: DC | PRN

## 2012-12-15 MED ORDER — DEXAMETHASONE SODIUM PHOSPHATE 4 MG/ML IJ SOLN
INTRAMUSCULAR | Status: DC | PRN
  Administered 2012-12-15: 4 mg via INTRAVENOUS

## 2012-12-15 SURGICAL SUPPLY — 47 items
ATTRACTOMAT 16X20 MAGNETIC DRP (DRAPES) IMPLANT
BLADE SURG 15 STRL LF DISP TIS (BLADE) ×4 IMPLANT
BLADE SURG 15 STRL SS (BLADE) ×2
BUR CROSS CUT FISSURE 1.6 (BURR) ×3 IMPLANT
BUR EGG ELITE 4.0 (BURR) ×3 IMPLANT
BUR RND FLUTED 2.5 (BURR) IMPLANT
CANISTER SUCTION 2500CC (MISCELLANEOUS) ×3 IMPLANT
CLEANER TIP ELECTROSURG 2X2 (MISCELLANEOUS) IMPLANT
CLOTH BEACON ORANGE TIMEOUT ST (SAFETY) ×3 IMPLANT
CONT SPEC 4OZ CLIKSEAL STRL BL (MISCELLANEOUS) IMPLANT
COVER SURGICAL LIGHT HANDLE (MISCELLANEOUS) ×3 IMPLANT
DRSG EMULSION OIL 3X3 NADH (GAUZE/BANDAGES/DRESSINGS) ×3 IMPLANT
ELECT COATED BLADE 2.86 ST (ELECTRODE) ×3 IMPLANT
ELECT REM PT RETURN 9FT ADLT (ELECTROSURGICAL) ×3
ELECTRODE REM PT RTRN 9FT ADLT (ELECTROSURGICAL) ×2 IMPLANT
GAUZE PACKING FOLDED 2  STR (GAUZE/BANDAGES/DRESSINGS) ×1
GAUZE PACKING FOLDED 2 STR (GAUZE/BANDAGES/DRESSINGS) ×2 IMPLANT
GAUZE SPONGE 4X4 16PLY XRAY LF (GAUZE/BANDAGES/DRESSINGS) ×3 IMPLANT
GLOVE BIO SURGEON STRL SZ 6.5 (GLOVE) IMPLANT
GLOVE BIO SURGEON STRL SZ7.5 (GLOVE) IMPLANT
GLOVE BIOGEL PI IND STRL 6.5 (GLOVE) IMPLANT
GLOVE BIOGEL PI IND STRL 7.5 (GLOVE) ×2 IMPLANT
GLOVE BIOGEL PI INDICATOR 6.5 (GLOVE)
GLOVE BIOGEL PI INDICATOR 7.5 (GLOVE) ×1
GLOVE ORTHO TXT STRL SZ7.5 (GLOVE) ×3 IMPLANT
GOWN STRL NON-REIN LRG LVL3 (GOWN DISPOSABLE) ×9 IMPLANT
KIT BASIN OR (CUSTOM PROCEDURE TRAY) ×3 IMPLANT
KIT ROOM TURNOVER OR (KITS) ×3 IMPLANT
NEEDLE BLUNT 16X1.5 OR ONLY (NEEDLE) ×3 IMPLANT
NEEDLE DENTAL 27 LONG (NEEDLE) ×3 IMPLANT
NEEDLE HYPO 25GX1X1/2 BEV (NEEDLE) ×6 IMPLANT
NS IRRIG 1000ML POUR BTL (IV SOLUTION) ×3 IMPLANT
PACK EENT II TURBAN DRAPE (CUSTOM PROCEDURE TRAY) ×3 IMPLANT
PAD ARMBOARD 7.5X6 YLW CONV (MISCELLANEOUS) ×6 IMPLANT
PENCIL BUTTON HOLSTER BLD 10FT (ELECTRODE) ×3 IMPLANT
SOLUTION BETADINE 4OZ (MISCELLANEOUS) IMPLANT
SPONGE GAUZE 4X4 12PLY (GAUZE/BANDAGES/DRESSINGS) ×3 IMPLANT
SUT CHROMIC 3 0 PS 2 (SUTURE) ×3 IMPLANT
SUT SILK 2 0 SH (SUTURE) ×6 IMPLANT
SYR 50ML SLIP (SYRINGE) ×3 IMPLANT
SYR BULB IRRIGATION 50ML (SYRINGE) ×3 IMPLANT
TAPE CLOTH SURG 4X10 WHT LF (GAUZE/BANDAGES/DRESSINGS) ×3 IMPLANT
TOOTHBRUSH ADULT (PERSONAL CARE ITEMS) IMPLANT
TOWEL OR 17X24 6PK STRL BLUE (TOWEL DISPOSABLE) ×3 IMPLANT
TOWEL OR 17X26 10 PK STRL BLUE (TOWEL DISPOSABLE) ×3 IMPLANT
TUBE CONNECTING 12X1/4 (SUCTIONS) ×3 IMPLANT
WATER STERILE IRR 1000ML POUR (IV SOLUTION) IMPLANT

## 2012-12-15 NOTE — Progress Notes (Signed)
[facility] PROGRESS NOTE  Krista Thomas ZHY:[id number] DOB: 01-Mar-1966 DOA: 12/13/2012 PCP: Selinda Flavin, MD  Assessment/Plan: 1. Right submandibular space infection. Currently on Unasyn and doing well. Surgery was consulted and plan to OR this morning, I appreciate their help.   Code Status: Full Family Communication: mother  Disposition Plan: pending OR.   Consultants:  Oral surgery  Procedures:  none  Antibiotics:  Unasyn 1/19>>  Vancomycin 1/19>>1/21   HPI/Subjective: NAD, sleeping comfortably. Complains of a dry mouth since she is NPO.   Objective: Filed Vitals:   12/14/12 0548 12/14/12 1434 12/14/12 2131 12/15/12 0516  BP: 102/60 101/59 103/65 99/62  Pulse: 75 73 75 71  Temp: 98.1 F (36.7 C) 98.1 F (36.7 C) 98.1 F (36.7 C) 98.2 F (36.8 C)  TempSrc: Oral  Oral Oral  Resp: 18 20 18 16   Height:      Weight:      SpO2: 97% 93% 98% 96%    Intake/Output Summary (Last 24 hours) at 12/15/12 1238 Last data filed at 12/15/12 0900  Gross per 24 hour  Intake   3850 ml  Output      0 ml  Net   3850 ml   Filed Weights   12/13/12 0703 12/13/12 1531  Weight: 68.493 kg (151 lb) 68.04 kg (150 lb)    Exam:   General:  NAD  HEENT: right mandibular edema, swelling and tenderness, trismus present thus complete oral exam difficult.   Cardiovascular: RRR. No MRG  Respiratory: CTA BIL  Abdomen: soft, NTTP  Data Reviewed: Basic Metabolic Panel:  Lab 12/14/12 2952 12/13/12 0713  NA 138 133*  K 3.8 4.0  CL 104 99  CO2 23 26  GLUCOSE 85 86  BUN 6 9  CREATININE 0.62 0.82  CALCIUM 8.1* 8.8  MG -- --  PHOS -- --   CBC:  Lab 12/14/12 0709 12/13/12 0713  WBC 7.3 12.4*  NEUTROABS -- 9.0*  HGB 11.4* 13.4  HCT 33.8* 38.8  MCV 92.3 92.6  PLT 305 347   Studies: Dg Orthopantogram  12/14/2012  *RADIOLOGY REPORT*  Clinical Data: Dental abscess, right jaw pain  ORTHOPANTOGRAM/PANORAMIC  Comparison:  CT 12/13/2012  Findings: Multiple missing  teeth and restorations.  There is lucency around the roots of tooth number 31.  Blurring limits evaluation of incisors.  The mandible appears intact.  IMPRESSION:  Periapical lucency consistent with abscess, tooth 31.  Recommend dental films for complete evaluation.   Original Report Authenticated By: D. Andria Rhein, MD     Scheduled Meds:    . ampicillin-sulbactam (UNASYN) IV  3 g Intravenous Q6H  . citalopram  40 mg Oral Daily  . vancomycin  750 mg Intravenous Q12H   Continuous Infusions:    . sodium chloride 1,000 mL (12/15/12 1237)    Principal Problem:  *Dental abscess Active Problems:  Dehydration  Intractable pain   Pamella Pert  [facility] Pager [phone]. If 8PM-8AM, please contact night-coverage at [email or web address], password [facility] 12/15/2012, 12:38 PM  LOS: 2 days

## 2012-12-15 NOTE — Op Note (Addendum)
12/13/2012 - 12/15/2012  8:44 PM  PATIENT:  Krista Thomas  47 y.o. female  PRE-OPERATIVE DIAGNOSIS:  Right Submandibular space infection.  POST-OPERATIVE DIAGNOSIS:  Right Submandibular space infection, Right Submandibular space infection, Carious #31, Carious #30  PROCEDURE:  Procedure(s): EXTRACTION OF #30, 31 EXTRAORAL INCISION AND DRAINAGE OF RIGHT SUBMANDIBULAR SPACE AND PTERYGOMANDIBULAR SPACE ABSCESS  INDICATIONS FOR SURGERY: The patient reports that she developed facial swelling last Thursday and she went to her primary care physician. She was prescribed Penicillin VK and was sent home. She then went to the ER/Urgent Care in [place] and was seen and released. She then went to [facility] and was transferred to [facility] for evaluation of Odontogenic Abscess. CT scan shows the Submandibular and Pterygomandibular Spaces having fluid collections associated with the carious tooth #31. She was taken to the OR for I&D of right submandibular and pterygomandibular space infection and extraction of #31 and other carious teeth.     SURGEON:  Surgeon(s): Francene Finders, DDS  PHYSICIAN ASSISTANT: None  ASSISTANTS:None  ANESTHESIA: General Anesthesia   PROCEDURE IN DETAIL:  The patient was seen in the pre-operative area.  The consent was verified, the history and physical were updated, and the patient was marked.  The patient was then taken to the OR by anesthesia, placed on the bed in a supine position, and was intubated.  The patient was then prepared and draped as usual for Oral and Maxillofacial Surgical Procedures.  A time out was performed and a throat pack (moisten Raytec) was placed.  Next, local anesthetic was infiltrated extraorally and intraorally in the right mandibular region.  A right inferior alveolar nerve block was performed.  Next, a 15 blade was used to make a full thickness mucoperiosteal incision with a distobuccal release distal to tooth #31.  Tooth #31 was removed,  which was the tooth that was contributing to the fascial space abscess.  Tooth #30 was also severely fractured and carious and was non-restorable, therefore, removed this tooth as well.  Both of these extractions were simple.  Elevators and forceps were used for their removal.  There was also dissection on the lingual of the area of #30 and #31; however, only gas was expressed from the subperiosteal dissection.    Our attention was then turned extraorally, where a three centimeter incision was made two centimeters inferior to the inferior border of the mandible anterior to the anti-gonial notch using a 15 blade.  This incision was taken down through skin into subcutaneous tissue.  Next a tonsillar hemostat was introduced into the incision and blunt dissection was carried down to the inferior border and the the medial aspect of the right mandible.  Once again, only gas was expressed the submandibular space.  The blunt dissection was carried superiorly into the pterygomandibular space; however, no purulence was expressed only gas was expressed the the potential space.  It was evident that the antibiotic therapy was effective.  Copious irrigation was used intraorally and extraorally.  A 1/4 inch penrose drain x 2 was placed into the submandibular and pterygomandibular spaces and secured to the skin with 2-0 silk suture.  The extraction sites were sutured with 3-0 chromic gut suture.  The throat pack was removed and all counts were correct at the conclusion of the case.  The patient was extubated and taken to PACU in a stable fashion.  EBL:  Total I/O In: 800 [I.V.:800] Out: -   DRAINS: none   LOCAL MEDICATIONS USED: 10 mL  MARCAINE 0.25% with 1:200,000 epinephrine , 10 mL LIDOCAINE 1:100,000 epinephrine  SPECIMEN:  No Specimen  DISPOSITION OF SPECIMEN:  N/A  COUNTS:  YES  PLAN OF CARE: Admit to inpatient   PATIENT DISPOSITION:  PACU - hemodynamically stable.   Delay start of Pharmacological VTE  agent (>24hrs) due to surgical blood loss or risk of bleeding:  not applicable

## 2012-12-15 NOTE — Transfer of Care (Signed)
Immediate Anesthesia Transfer of Care Note  Patient: Krista Thomas  Procedure(s) Performed: Procedure(s) (LRB) with comments: DENTAL RESTORATION/EXTRACTIONS (Right) DEBRIDEMENT MANDIBLE (Right) - Dental Extractions with I&D of right sided submandibular space infection.  Patient Location: PACU  Anesthesia Type:General  Level of Consciousness: awake, oriented, patient cooperative and responds to stimulation  Airway & Oxygen Therapy: Patient Spontanous Breathing and Patient connected to nasal cannula oxygen  Post-op Assessment: Report given to PACU RN, Post -op Vital signs reviewed and stable and Patient moving all extremities X 4  Post vital signs: Reviewed and stable  Complications: No apparent anesthesia complications

## 2012-12-15 NOTE — Interval H&P Note (Signed)
History and Physical Interval Note:  12/15/2012 6:13 PM  Krista Thomas  has presented today for surgery, with the diagnosis of Right Submandibular space infection.  The various methods of treatment have been discussed with the patient and family. After consideration of risks, benefits and other options for treatment, the patient has consented to:  Extraoral and Intraoral Incision and Drainage of Right Facial Spaces (Submandibular and Pterygomandibular), and Extraction of #31 and any necessary teeth as a surgical intervention .  The patient's history has been reviewed, patient examined, no change in status, stable for surgery.  I have reviewed the patient's chart and labs.  Questions were answered to the patient's satisfaction.     Cuthbert,Lizbet Cirrincione L

## 2012-12-15 NOTE — Anesthesia Postprocedure Evaluation (Signed)
  Anesthesia Post-op Note  Patient: Krista Thomas  Procedure(s) Performed: Procedure(s) (LRB) with comments: DENTAL RESTORATION/EXTRACTIONS (Right) DEBRIDEMENT MANDIBLE (Right) - Dental Extractions with I&D of right sided submandibular space infection.  Patient Location: PACU  Anesthesia Type:General  Level of Consciousness: awake  Airway and Oxygen Therapy: Patient Spontanous Breathing  Post-op Pain: mild  Post-op Assessment: Post-op Vital signs reviewed, Patient's Cardiovascular Status Stable, Respiratory Function Stable, Patent Airway, No signs of Nausea or vomiting and Pain level controlled  Post-op Vital Signs: stable  Complications: No apparent anesthesia complications

## 2012-12-15 NOTE — Progress Notes (Signed)
Oral and Maxillofacial Surgery - Progress Note Krista Thomas is a 47 y.o. female patient admitted with right facial swelling associated with an abscess at tooth #31.  Diagnosis:  1. Dental abscess   2. Dehydration   3. Intractable pain   4. Depression   5. Carious #31   Meds:  Current Facility-Administered Medications  Medication Dose Route Frequency Provider Last Rate Last Dose  . 0.9 %  sodium chloride infusion   Intravenous Continuous Ripudeep K Rai, MD 100 mL/hr at 12/15/12 1237 1,000 mL at 12/15/12 1237  . acetaminophen (TYLENOL) tablet 650 mg  650 mg Oral Q6H PRN Ripudeep Jenna Luo, MD       Or  . acetaminophen (TYLENOL) suppository 650 mg  650 mg Rectal Q6H PRN Ripudeep Jenna Luo, MD      . ALPRAZolam Prudy Feeler) tablet 1 mg  1 mg Oral TID PRN Ripudeep Jenna Luo, MD   1 mg at 12/14/12 2316  . alum & mag hydroxide-simeth (MAALOX/MYLANTA) 200-200-20 MG/5ML suspension 30 mL  30 mL Oral Q6H PRN Ripudeep K Rai, MD      . Ampicillin-Sulbactam (UNASYN) 3 g in sodium chloride 0.9 % 100 mL IVPB  3 g Intravenous Q6H Crystal Stillinger Robertson, PHARMD   3 g at 12/15/12 1239  . citalopram (CELEXA) tablet 40 mg  40 mg Oral Daily Ripudeep K Rai, MD   40 mg at 12/15/12 1000  . HYDROmorphone (DILAUDID) injection 0.25-0.5 mg  0.25-0.5 mg Intravenous Q5 min PRN Bedelia Person, MD      . HYDROmorphone (DILAUDID) injection 1-2 mg  1-2 mg Intravenous Q4H PRN Ripudeep Jenna Luo, MD   1 mg at 12/15/12 1532  . ondansetron (ZOFRAN) tablet 4 mg  4 mg Oral Q6H PRN Ripudeep K Rai, MD       Or  . ondansetron (ZOFRAN) injection 4 mg  4 mg Intravenous Q6H PRN Ripudeep Jenna Luo, MD   4 mg at 12/15/12 1247  . oxyCODONE (Oxy IR/ROXICODONE) immediate release tablet 5 mg  5 mg Oral Q3H PRN Ripudeep Jenna Luo, MD   5 mg at 12/15/12 0850  . oxyCODONE (Oxy IR/ROXICODONE) immediate release tablet 5 mg  5 mg Oral Once PRN Bedelia Person, MD       Or  . oxyCODONE (ROXICODONE) 5 MG/5ML solution 5 mg  5 mg Oral Once PRN Bedelia Person, MD      . promethazine  (PHENERGAN) injection 6.25-12.5 mg  6.25-12.5 mg Intravenous Q15 min PRN Bedelia Person, MD       Facility-Administered Medications Ordered in Other Encounters  Medication Dose Route Frequency Provider Last Rate Last Dose  . lactated ringers infusion    Continuous PRN Angelica Pou, RN      . ondansetron (ZOFRAN) injection    PRN Angelica Pou, RN   4 mg at 12/15/12 1610    Allergies: No Known Allergies  Problems: Principal Problem:  *Dental abscess Active Problems:  Dehydration  Intractable pain  Dental caries into pulp, #31   Vitals: BP 122/73  Pulse 58  Temp 97.7 F (36.5 C) (Oral)  Resp 16  Ht 5\' 2"  (1.575 m)  Wt 68.04 kg (150 lb)  BMI 27.44 kg/m2  SpO2 92%  LMP 12/12/2012  Labs:  CBC    Component Value Date/Time   WBC 7.3 12/14/2012 0709   RBC 3.66* 12/14/2012 0709   HGB 11.4* 12/14/2012 0709   HCT 33.8* 12/14/2012 0709   PLT 305 12/14/2012 0709   MCV 92.3 12/14/2012  0709   MCH 31.1 12/14/2012 0709   MCHC 33.7 12/14/2012 0709   RDW 12.8 12/14/2012 0709   LYMPHSABS 1.8 12/13/2012 0713   MONOABS 1.3* 12/13/2012 0713   EOSABS 0.3 12/13/2012 0713   BASOSABS 0.0 12/13/2012 0713      Radiology: Dg Orthopantogram  12/14/2012  *RADIOLOGY REPORT*  Clinical Data: Dental abscess, right jaw pain  ORTHOPANTOGRAM/PANORAMIC  Comparison:  CT 12/13/2012  Findings: Multiple missing teeth and restorations.  There is lucency around the roots of tooth number 31.  Blurring limits evaluation of incisors.  The mandible appears intact.  IMPRESSION:  Periapical lucency consistent with abscess, tooth 31.  Recommend dental films for complete evaluation.   Original Report Authenticated By: D. Andria Rhein, MD     CT scan: Right submandibular and right pterygomandibular space abscess formation.  Exam: General appearance: Sedated Head: Normocephalic, without obvious abnormality, atraumatic Throat: abnormal findings: dentition: multiple carries, moderate oropharyngeal erythema and  right submandibular swelling slightly decreased and trismus present and multiple carious teeth persent.  Tooth #31 symptomatic caries. The patient's edema has decreased since yesterday with antibiotics alone.    A/P: Carious #31 with periapical abscess, Right Submandibular and Pterygomandibular space abscess which appears to be responding well to antibiotic therapy. 1.  Taking patient to OR tonight for extraction of necessary teeth and I&D extraoral and intraoral of right fascial spaces. 2.  Patient should continue current antibiotic therapy.   [place],Helio Lack L 12/15/2012, 6:47 PM

## 2012-12-15 NOTE — Preoperative (Signed)
Beta Blockers   Reason not to administer Beta Blockers:Not Applicable 

## 2012-12-15 NOTE — Brief Op Note (Signed)
12/13/2012 - 12/15/2012  8:37 PM  PATIENT:  Krista Thomas  47 y.o. female  PRE-OPERATIVE DIAGNOSIS:  Right Submandibular Space Infection, Right Pterygomandibular Space Infection, Multiple Caries  POST-OPERATIVE DIAGNOSIS:  Right Submandibular Space Infection, Right Pterygomandibular Space Infection, Carious #31, 30.    PROCEDURE: Extraoral Incision and Drainage of Right Submandibular Space Infection, Right Pterygomandibular Space Infection, Extraction of #31, #30.     SURGEON:  Surgeon(s) and Role:    * Francene Finders, DDS - Primary  PHYSICIAN ASSISTANT: none  ASSISTANTS: none   ANESTHESIA:   general  EBL:  Total I/O In: 800 [I.V.:800] Out: -   BLOOD ADMINISTERED:none  DRAINS: Penrose drain in the right submandibular and right pterygomandibular spaces   LOCAL MEDICATIONS USED:  MARCAINE 0.25% with 1:200,000 epinephrine 10mL, LIDOCAINE 2.0% with 1:100,000 epinephrine 10 ml  SPECIMEN:  No Specimen  DISPOSITION OF SPECIMEN:  N/A  COUNTS:  YES  TOURNIQUET:  * No tourniquets in log *  DICTATION: .Note written in EPIC  PLAN OF CARE: Admit to inpatient   PATIENT DISPOSITION:  PACU - hemodynamically stable.   Delay start of Pharmacological VTE agent (>24hrs) due to surgical blood loss or risk of bleeding: not applicable

## 2012-12-15 NOTE — Anesthesia Preprocedure Evaluation (Addendum)
Anesthesia Evaluation  Patient identified by MRN, date of birth, ID band Patient awake    Reviewed: Allergy & Precautions, H&P , NPO status , Patient's Chart, lab work & pertinent test results  History of Anesthesia Complications Negative for: history of anesthetic complications  Airway Mallampati: II TM Distance: >3 FB Neck ROM: Limited  Mouth opening: Limited Mouth Opening  Dental  (+) Teeth Intact and Dental Advisory Given   Pulmonary COPDCurrent Smoker,  Hoarse Facial swelling - rhonchi  Pulmonary exam normal       Cardiovascular Exercise Tolerance: Good negative cardio ROS  Rhythm:Regular Rate:Normal     Neuro/Psych    GI/Hepatic negative GI ROS, Neg liver ROS,   Endo/Other  negative endocrine ROS  Renal/GU negative Renal ROS  negative genitourinary   Musculoskeletal negative musculoskeletal ROS (+)   Abdominal   Peds  Hematology negative hematology ROS (+)   Anesthesia Other Findings Unable to open mouth wide secondary to pain  Reproductive/Obstetrics                          Anesthesia Physical Anesthesia Plan  ASA: II  Anesthesia Plan: General   Post-op Pain Management:    Induction: Intravenous  Airway Management Planned: Nasal ETT and Fiberoptic Intubation Planned  Additional Equipment:   Intra-op Plan:   Post-operative Plan: Extubation in OR  Informed Consent: I have reviewed the patients History and Physical, chart, labs and discussed the procedure including the risks, benefits and alternatives for the proposed anesthesia with the patient or authorized representative who has indicated his/her understanding and acceptance.     Plan Discussed with: CRNA and Surgeon  Anesthesia Plan Comments:        Anesthesia Quick Evaluation

## 2012-12-16 DIAGNOSIS — D649 Anemia, unspecified: Secondary | ICD-10-CM | POA: Diagnosis not present

## 2012-12-16 MED ORDER — AMOXICILLIN-POT CLAVULANATE 875-125 MG PO TABS: 1.0000 | ORAL_TABLET | Freq: Two times a day (BID) | ORAL | Status: DC

## 2012-12-16 MED ORDER — OXYCODONE HCL 5 MG PO TABS: 5.0000 mg | ORAL_TABLET | Freq: Four times a day (QID) | ORAL | Status: DC | PRN

## 2012-12-16 NOTE — Progress Notes (Signed)
ANTIBIOTIC CONSULT NOTE - FOLLOW UP  Pharmacy Consult:  Unasyn Indication:  Facial cellulitis/abscess  No Known Allergies  Patient Measurements: Height: 5\' 2"  (157.5 cm) Weight: 150 lb (68.04 kg) IBW/kg (Calculated) : 50.1   Vital Signs: Temp: 98.1 F (36.7 C) (01/22 0605) Temp src: Oral (01/22 0605) BP: 126/76 mmHg (01/22 0605) Pulse Rate: 64  (01/22 0605) Intake/Output from previous day: 01/21 0701 - 01/22 0700 In: 850 [I.V.:850] Out: 130 [Urine:130]  Labs:  [facility] 12/14/12 0709  WBC 7.3  HGB 11.4*  PLT 305  LABCREA --  CREATININE 0.62   Estimated Creatinine Clearance: 80.3 ml/min (by C-G formula based on Cr of 0.62). No results found for this basename: VANCOTROUGH:2,VANCOPEAK:2,VANCORANDOM:2,GENTTROUGH:2,GENTPEAK:2,GENTRANDOM:2,TOBRATROUGH:2,TOBRAPEAK:2,TOBRARND:2,AMIKACINPEAK:2,AMIKACINTROU:2,AMIKACIN:2, in the last 72 hours   Microbiology: Recent Results (from the past 720 hour(s))  CULTURE, BLOOD (ROUTINE X 2)     Status: Normal (Preliminary result)   Collection Time   12/13/12  7:40 PM      Component Value Range Status Comment   Specimen Description BLOOD LEFT ARM   Final    Special Requests BOTTLES DRAWN AEROBIC AND ANAEROBIC 10CC   Final    Culture  Setup Time 12/14/2012 01:42   Final    Culture     Final    Value:        BLOOD CULTURE RECEIVED NO GROWTH TO DATE CULTURE WILL BE HELD FOR 5 DAYS BEFORE ISSUING A FINAL NEGATIVE REPORT   Report Status PENDING   Incomplete   CULTURE, BLOOD (ROUTINE X 2)     Status: Normal (Preliminary result)   Collection Time   12/13/12  7:50 PM      Component Value Range Status Comment   Specimen Description BLOOD LEFT HAND   Final    Special Requests BOTTLES DRAWN AEROBIC AND ANAEROBIC 10CC   Final    Culture  Setup Time 12/14/2012 01:42   Final    Culture     Final    Value:        BLOOD CULTURE RECEIVED NO GROWTH TO DATE CULTURE WILL BE HELD FOR 5 DAYS BEFORE ISSUING A FINAL NEGATIVE REPORT   Report Status PENDING    Incomplete   SURGICAL PCR SCREEN     Status: Normal   Collection Time   12/15/12 12:50 AM      Component Value Range Status Comment   MRSA, PCR NEGATIVE  NEGATIVE Final    Staphylococcus aureus NEGATIVE  NEGATIVE Final        Assessment: 46 YOF admitted with dental pain and right-sided facial swelling.  Patient was previously treated with penicillin and clindamycin for approximately 2 days.  Pharmacy consulted to manage vancomycin and Unasyn for facial cellulitis and abscess, now on Unasyn monotherapy s/p I&D.  Her renal function has been stable.  Vanc 1/19 >> 1/21 Unasyn 1/19 >>   1/19 blood cx - NGTD   Goal of Therapy:  Clearance of infection   Plan:  - Continue Unasyn 3g IV Q6H - Pharmacy will sign off as patient's renal function is normal and dosage adjustment is likely unnecessary.     Zakery Normington D. Laney Potash, PharmD, BCPS Pager:  [phone] 12/16/2012, 9:41 AM

## 2012-12-16 NOTE — Discharge Summary (Signed)
Physician Discharge Summary  Emmamarie Kluender AVW:[id number] DOB: 11/09/1966 DOA: 12/13/2012  PCP: Selinda Flavin, MD  Admit date: 12/13/2012 Discharge date: 12/16/2012  Time spent: less than 30 minutes  Recommendations for Outpatient Follow-up:  1. With Dr. Lincoln Brigham, Dental Surgeon on 12/17/2012 at 2 pm. 2. With Dr. Selinda Flavin, PCP.  Discharge Diagnoses:  Principal Problem:  *Dental abscess Active Problems:  Dehydration  Intractable pain  Dental caries into pulp, #31   Discharge Condition: Improved and stable  Diet recommendation: Heart healthy diet  Filed Weights   12/13/12 0703 12/13/12 1531  Weight: 68.493 kg (151 lb) 68.04 kg (150 lb)    History of present illness:  Patient is a 47 year old female with no significant past medical history presented to [facility] ED with right-sided dental pain for the last several weeks. Patient had just started a new job hence did not seek dental care right away. She noticed that her right side of the face was was progressively getting swollen and hard dental pain was worse in the last 2 days. She went to her family physician who prescribed her antibiotics which did not help. She also reported associated headache, neck pain on the right side, difficulty eating and difficulty sleeping due to pain but no significant fevers or chills. She then subsequently went to ER in [place] and was prescribed oral clindamycin and Vicodin for the pain. Patient states that her symptoms did not improve hence he went to the [facility] ED for further workup.  In [facility] ED, ed physician called ENT, Dr. Lazarus Salines who reviewed the CT imaging and recommended IV antibiotics and oral surgery was not was emergently needed. Patient was subsequently transferred to [facility] Course:   Right Submandibular Space Infection, Right Pterygomandibular Space Infection, Carious #31, 30. Patient was admitted to hospital. She was started on broad-spectrum IV  antibiotics. Dental surgery was consulted and performed Extraoral Incision and Drainage of Right Submandibular Space Infection, Right Pterygomandibular Space Infection, Extraction of #31, #30 on 1/21. Patient denies any further dental pain. She has some pain at the site of the surgery but is able to eat and swallow without difficulty. Discussed with Dr. Jeanice Lim who cleared the patient for discharge home on one week course of oral Augmentin and followup with him on 1/23 in office when surgical drain will be removed. Patient indicates that Vicodin does not help and hence was given a prescription for oxycodone.  Anemia Consider outpatient evaluation as deemed necessary.  Dehydration Resolved with IV fluids.  Depression/anxiety Stable. Continue home medications.  Procedures:  Extraoral Incision and Drainage of Right Submandibular Space Infection, Right Pterygomandibular Space Infection, Extraction of #31, #30 on 1/21   Consultations:  Dental surgery: Dr. Lincoln Brigham  Discharge Exam:  Complaints Denies dental pain. Complains of pain at surgical site-mild. Able to swallow and eat without difficulty. No dyspnea.  Filed Vitals:   12/15/12 2110 12/15/12 2126 12/16/12 0213 12/16/12 0605  BP:  138/74 137/55 126/76  Pulse: 98 96 54 64  Temp: 97.1 F (36.2 C) 98.1 F (36.7 C) 98.1 F (36.7 C) 98.1 F (36.7 C)  TempSrc:  Axillary Oral Oral  Resp: 16 18 18 18   Height:      Weight:      SpO2: 94% 96% 92% 96%     General exam: Appears comfortable and ambulating in the room.  Respiratory system: Clear to auscultation. No increased work of breathing.  Cardiovascular system: First and second heart sounds heard, regular  rate and rhythm. No JVD, murmurs or pedal edema.  Gastrointestinal system: Abdomen is nondistended, soft and nontender. Normal bowel sounds heard.  Central nervous system: Alert and oriented. No focal neurological deficits.  ENT: Surgical dressing underneath her  right jaw/neck appears clean, dry and intact.  Discharge Instructions      Discharge Orders    Future Orders Please Complete By Expires   Diet - low sodium heart healthy      Increase activity slowly      Call MD for:  temperature >100.4      Call MD for:  severe uncontrolled pain      Call MD for:  redness, tenderness, or signs of infection (pain, swelling, redness, odor or green/yellow discharge around incision site)      Call MD for:  difficulty breathing, headache or visual disturbances          Medication List     As of 12/16/2012 10:37 AM    TAKE these medications         ALPRAZolam 1 MG tablet   Commonly known as: XANAX   Take 1 mg by mouth 3 (three) times daily as needed. anxiety      amoxicillin-clavulanate 875-125 MG per tablet   Commonly known as: AUGMENTIN   Take 1 tablet by mouth 2 (two) times daily.      citalopram 40 MG tablet   Commonly known as: CELEXA   Take 40 mg by mouth daily.      oxyCODONE 5 MG immediate release tablet   Commonly known as: Oxy IR/ROXICODONE   Take 1-2 tablets (5-10 mg total) by mouth every 6 (six) hours as needed for pain.        Follow-up Information    Follow up with Lomas,CHRISTOPHER L, DDS. On 12/17/2012. (2 pm.)    Contact information:   [address] [place] [address] [phone]       Schedule an appointment as soon as possible for a visit with Selinda Flavin, MD.   Contact information:   [address] [place] [address] [phone]           The results of significant diagnostics from this hospitalization (including imaging, microbiology, ancillary and laboratory) are listed below for reference.    Significant Diagnostic Studies: Dg Orthopantogram  12/14/2012  *RADIOLOGY REPORT*  Clinical Data: Dental abscess, right jaw pain  ORTHOPANTOGRAM/PANORAMIC  Comparison:  CT 12/13/2012  Findings: Multiple missing teeth and restorations.  There is lucency around the roots of tooth number 31.   Blurring limits evaluation of incisors.  The mandible appears intact.  IMPRESSION:  Periapical lucency consistent with abscess, tooth 31.  Recommend dental films for complete evaluation.   Original Report Authenticated By: D. Andria Rhein, MD    Ct Maxillofacial W/cm  12/13/2012  *RADIOLOGY REPORT*  Clinical Data: Right-sided facial pain and swelling for few weeks. Dental pain.  CT MAXILLOFACIAL WITH CONTRAST  Technique:  Multidetector CT imaging of the maxillofacial structures was performed with intravenous contrast. Multiplanar CT image reconstructions were also generated.  Contrast: 80mL OMNIPAQUE IOHEXOL 300 MG/ML  SOLN  Comparison: None.  Findings: Soft tissue windows demonstrate normal limited intracranial imaging.  Normal orbits and globes.  Mildly prominent right submandibular nodes which are likely reactive.  Subcutaneous edema centered about the right side of the mandible with overlying skin thickening.  A fluid collection positioned deep to the right side of the mandible which measures 2.9 x 1.4 cm on image  59/series 2 and has peripheral enhancement.  2.0 cm cranial caudal on coronal image 39/series 4.  All vascular structures enhance normally.  Normal submandibular and parotid glands.  Bone windows demonstrate clear paranasal sinuses and mastoid air cells. Lucency surrounding the root of a right mandibular tooth on sagittal image 28/series 7 is suspicious for peri apical abscess, likely the source of the submandibular fluid collection.  IMPRESSION: Right mandibular dental infection with adjacent subperiosteal abscess in the right floor of mouth.  Adjacent lymph nodes which are likely reactive.   Original Report Authenticated By: Jeronimo Greaves, M.D.     Microbiology: Recent Results (from the past 240 hour(s))  CULTURE, BLOOD (ROUTINE X 2)     Status: Normal (Preliminary result)   Collection Time   12/13/12  7:40 PM      Component Value Range Status Comment   Specimen Description BLOOD LEFT ARM    Final    Special Requests BOTTLES DRAWN AEROBIC AND ANAEROBIC 10CC   Final    Culture  Setup Time 12/14/2012 01:42   Final    Culture     Final    Value:        BLOOD CULTURE RECEIVED NO GROWTH TO DATE CULTURE WILL BE HELD FOR 5 DAYS BEFORE ISSUING A FINAL NEGATIVE REPORT   Report Status PENDING   Incomplete   CULTURE, BLOOD (ROUTINE X 2)     Status: Normal (Preliminary result)   Collection Time   12/13/12  7:50 PM      Component Value Range Status Comment   Specimen Description BLOOD LEFT HAND   Final    Special Requests BOTTLES DRAWN AEROBIC AND ANAEROBIC 10CC   Final    Culture  Setup Time 12/14/2012 01:42   Final    Culture     Final    Value:        BLOOD CULTURE RECEIVED NO GROWTH TO DATE CULTURE WILL BE HELD FOR 5 DAYS BEFORE ISSUING A FINAL NEGATIVE REPORT   Report Status PENDING   Incomplete   SURGICAL PCR SCREEN     Status: Normal   Collection Time   12/15/12 12:50 AM      Component Value Range Status Comment   MRSA, PCR NEGATIVE  NEGATIVE Final    Staphylococcus aureus NEGATIVE  NEGATIVE Final      Labs: Basic Metabolic Panel:  Lab 12/14/12 1610 12/13/12 0713  NA 138 133*  K 3.8 4.0  CL 104 99  CO2 23 26  GLUCOSE 85 86  BUN 6 9  CREATININE 0.62 0.82  CALCIUM 8.1* 8.8  MG -- --  PHOS -- --   Liver Function Tests: No results found for this basename: AST:5,ALT:5,ALKPHOS:5,BILITOT:5,PROT:5,ALBUMIN:5 in the last 168 hours No results found for this basename: LIPASE:5,AMYLASE:5 in the last 168 hours No results found for this basename: AMMONIA:5 in the last 168 hours CBC:  Lab 12/14/12 0709 12/13/12 0713  WBC 7.3 12.4*  NEUTROABS -- 9.0*  HGB 11.4* 13.4  HCT 33.8* 38.8  MCV 92.3 92.6  PLT 305 347   Cardiac Enzymes: No results found for this basename: CKTOTAL:5,CKMB:5,CKMBINDEX:5,TROPONINI:5 in the last 168 hours BNP: BNP (last 3 results) No results found for this basename: PROBNP:3 in the last 8760 hours CBG: No results found for this basename: GLUCAP:5 in  the last 168 hours     Signed:  Mckena Chern  [facility] 12/16/2012, 10:37 AM

## 2012-12-16 NOTE — Progress Notes (Signed)
Pt discharged to home with mother. Discharge instructions explained pt verbalized understanding

## 2012-12-18 ENCOUNTER — Encounter (HOSPITAL_COMMUNITY): Payer: Self-pay | Admitting: Oral and Maxillofacial Surgery

## 2012-12-20 LAB — CULTURE, BLOOD (ROUTINE X 2): Culture: NO GROWTH

## 2014-04-19 IMAGING — CT CT MAXILLOFACIAL W/ CM
3 series · 15 of 47 positions shown, 18 images · IV contrast (omnipaque)
Comparison: None.

CLINICAL DATA: Right-sided facial pain and swelling for few weeks.
Dental pain.

CT MAXILLOFACIAL WITH CONTRAST
TECHNIQUE: Multidetector CT imaging of the maxillofacial
structures was performed with intravenous contrast. Multiplanar CT
image reconstructions were also generated.
Contrast: 80mL OMNIPAQUE IOHEXOL 300 MG/ML  SOLN

[Series 2: max st axial · axial · 0.29mm/px · z∈[+10,+154]mm · 9 of 84 slices shown, 12 images]
[im 6/84  brain]
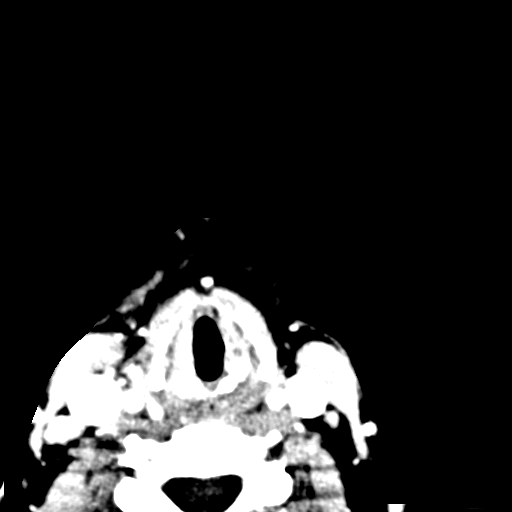
[im 6/84  bone]
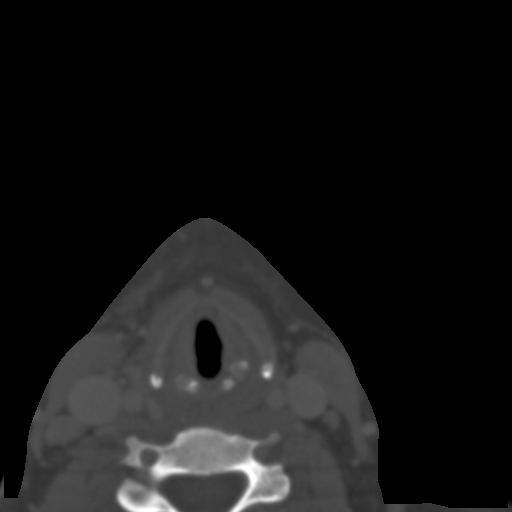
[im 15/84  bone]
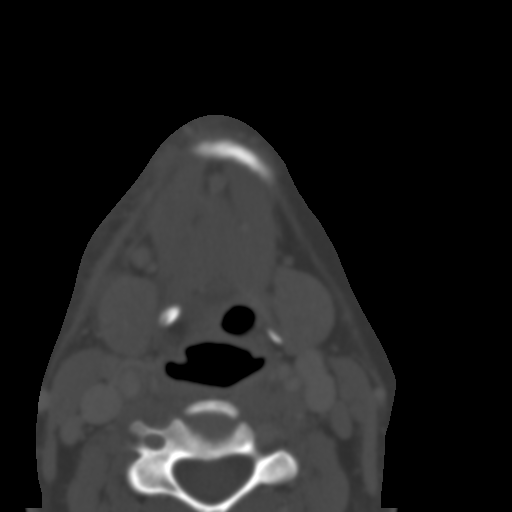
[im 23/84  bone]
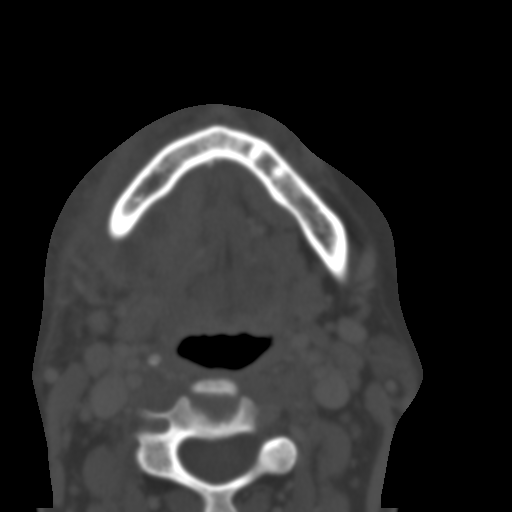
[im 32/84  bone]
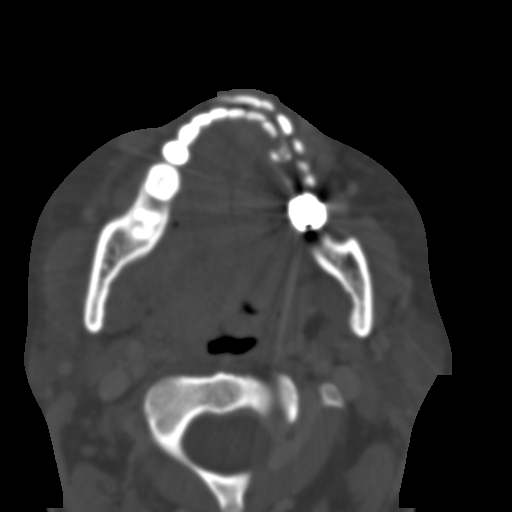
[im 43/84  brain]
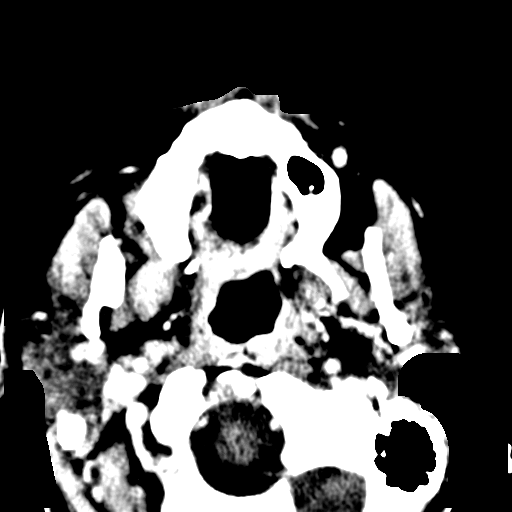
[im 43/84  bone]
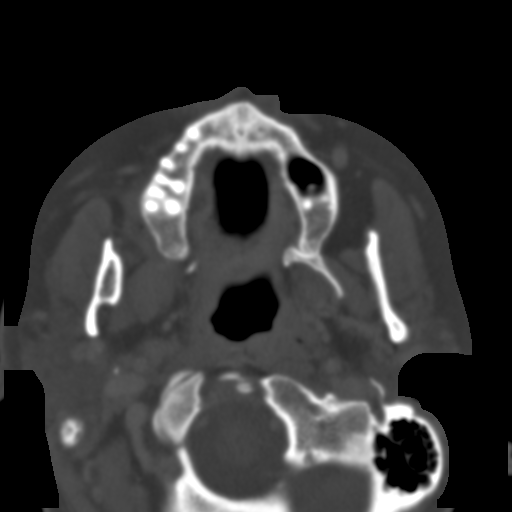
[im 52/84  bone]
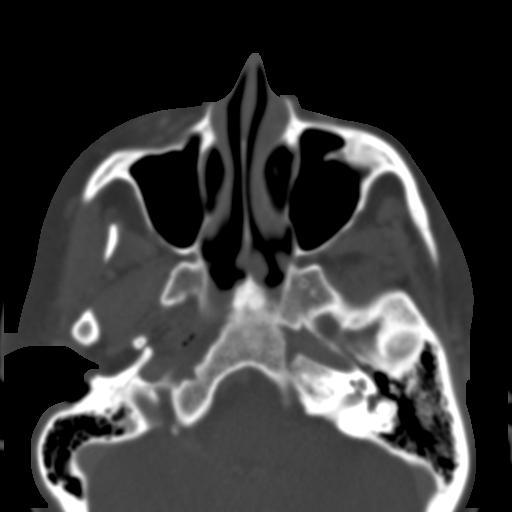
[im 61/84  bone]
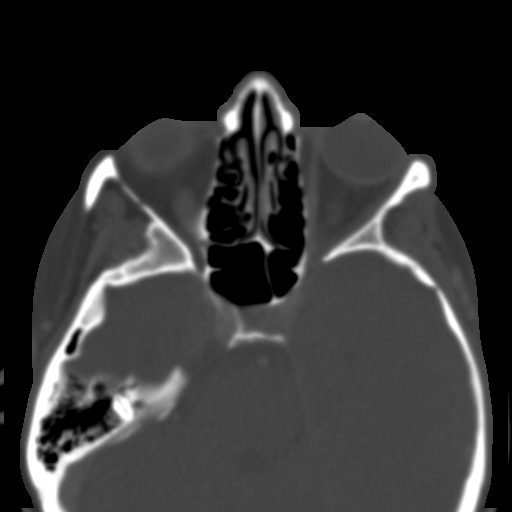
[im 69/84  bone]
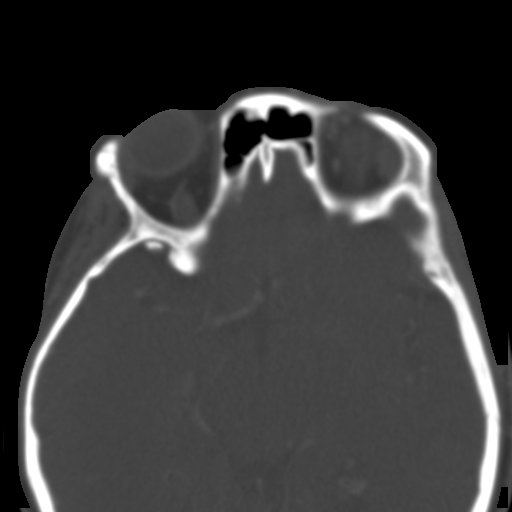
[im 78/84  brain]
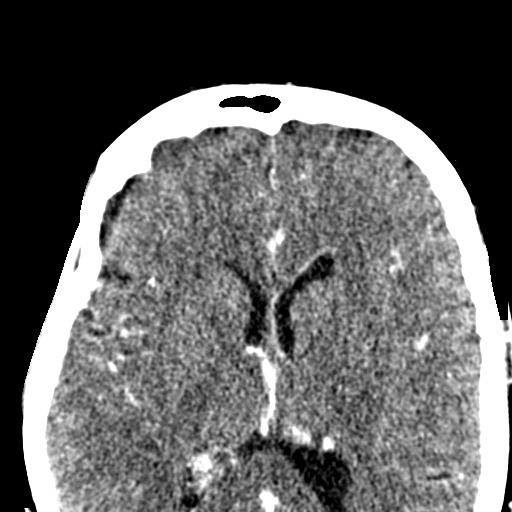
[im 78/84  bone]
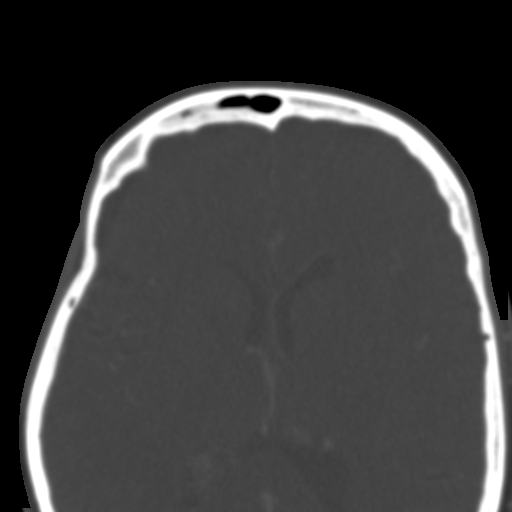

[Series 4: max st coro · coronal · 0.27mm/px · 3 of 68 slices shown]
[im 23/68  bone]
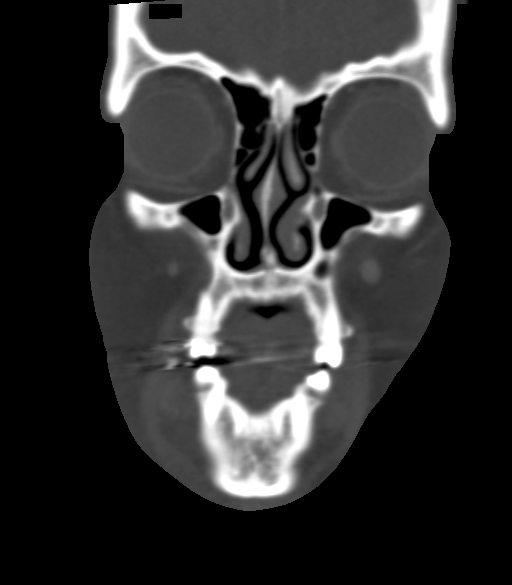
[im 30/68  bone]
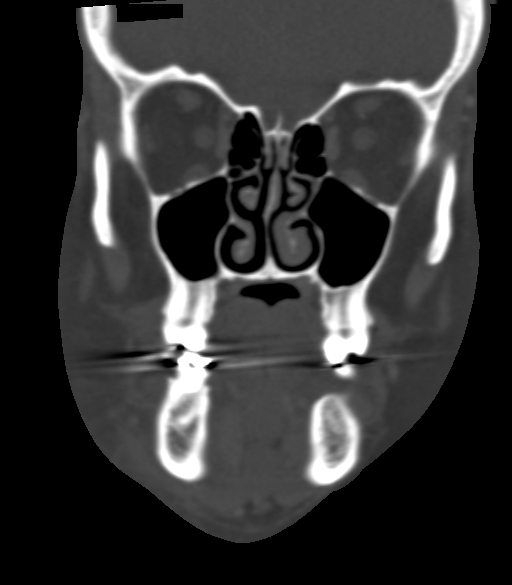
[im 38/68  bone]
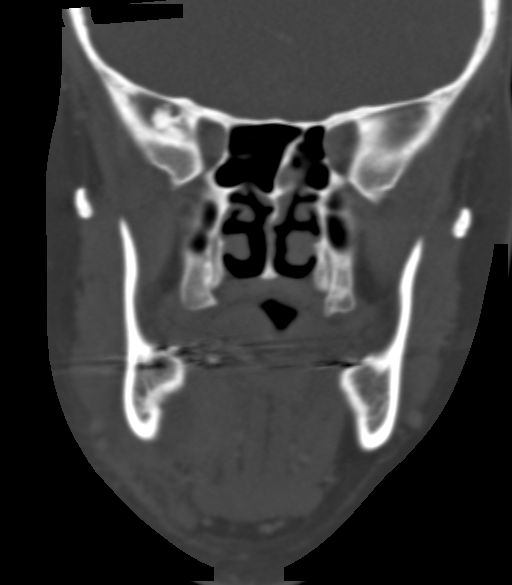

[Series 5: max st sag · sagittal · 0.28mm/px · 3 of 70 slices shown]
[im 24/70  bone]
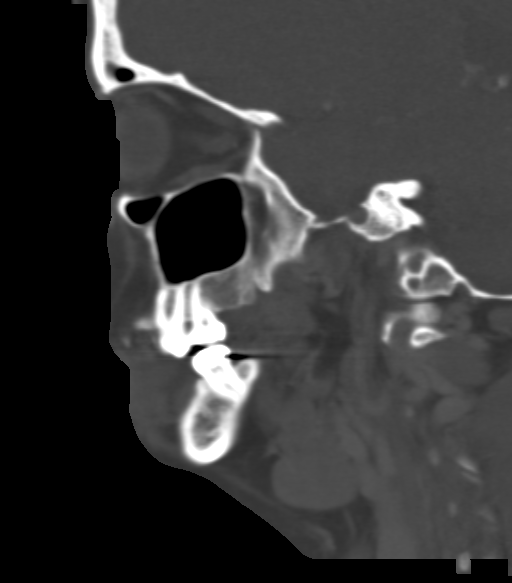
[im 35/70  bone]
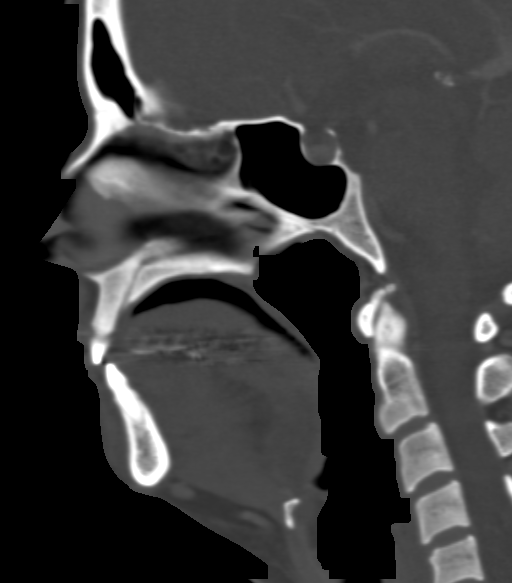
[im 47/70  bone]
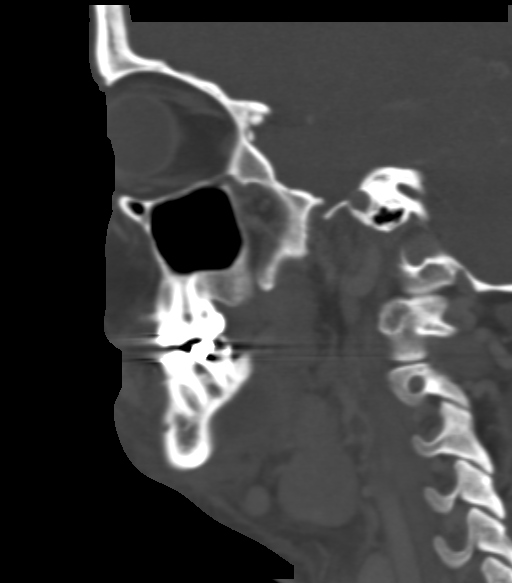

[15 of 47 positions shown; findings below may reference images not displayed]

FINDINGS: Soft tissue windows demonstrate normal limited
intracranial imaging.  Normal orbits and globes.  Mildly prominent
right submandibular nodes which are likely reactive.  Subcutaneous
edema centered about the right side of the mandible with overlying
skin thickening.  A fluid collection positioned deep to the right
side of the mandible which measures 2.9 x 1.4 cm on image 59/series
2 and has peripheral enhancement.  2.0 cm cranial caudal on coronal
image 39/series 4.

All vascular structures enhance normally.  Normal submandibular and
parotid glands.  Bone windows demonstrate clear paranasal sinuses
and mastoid air cells. Lucency surrounding the root of a right
mandibular tooth on sagittal image 28/series 7 is suspicious for
peri apical abscess, likely the source of the submandibular fluid
collection.
IMPRESSION: Right mandibular dental infection with adjacent subperiosteal
abscess in the right floor of mouth.  Adjacent lymph nodes which
are likely reactive.

## 2016-07-22 ENCOUNTER — Telehealth: Payer: Self-pay | Admitting: Physician Assistant

## 2016-07-23 NOTE — Telephone Encounter (Signed)
Pt given appt with Lawanna KobusAngel 07/26/16 at 11:10 and is aware to arrive 30 minutes prior.

## 2016-07-24 ENCOUNTER — Telehealth: Payer: Self-pay | Admitting: Physician Assistant

## 2016-07-24 NOTE — Telephone Encounter (Signed)
Patient aware and verbalizes understanding. 

## 2016-07-24 NOTE — Telephone Encounter (Signed)
She can stop the medication and be seen on Friday.  Please look into mental health in your county of residence.  I am nearing the end of what meds I know to do.  The mental health may have more meds and help with getting the meds.

## 2016-07-24 NOTE — Telephone Encounter (Signed)
Patient states that her prozac is not helping and she states that she is tiered all the time. Patient states that her depression is not improving she states that she does not even leave the house. She feels that the prozac is making it worse. She states that after she takes the medication that her mind just goes somewhere else. Please advise. Patient does have an appointment on Friday.

## 2016-07-26 ENCOUNTER — Encounter: Payer: Self-pay | Admitting: Physician Assistant

## 2016-07-26 ENCOUNTER — Ambulatory Visit (INDEPENDENT_AMBULATORY_CARE_PROVIDER_SITE_OTHER): Payer: Self-pay | Admitting: Physician Assistant

## 2016-07-26 VITALS — BP 112/80 | HR 83 | Temp 98.2°F | Ht 62.0 in | Wt 167.4 lb

## 2016-07-26 DIAGNOSIS — F39 Unspecified mood [affective] disorder: Secondary | ICD-10-CM

## 2016-07-26 DIAGNOSIS — F329 Major depressive disorder, single episode, unspecified: Secondary | ICD-10-CM

## 2016-07-26 DIAGNOSIS — F4321 Adjustment disorder with depressed mood: Secondary | ICD-10-CM | POA: Insufficient documentation

## 2016-07-26 DIAGNOSIS — F32A Depression, unspecified: Secondary | ICD-10-CM

## 2016-07-26 MED ORDER — PAROXETINE HCL 10 MG PO TABS: 10.0000 mg | ORAL_TABLET | Freq: Every day | ORAL | 1 refills | Status: DC

## 2016-07-26 MED ORDER — RISPERIDONE 0.5 MG PO TABS: 1.0000 mg | ORAL_TABLET | Freq: Every day | ORAL | 1 refills | Status: DC

## 2016-07-26 NOTE — Progress Notes (Signed)
BP 112/80 (BP Location: Left Arm, Patient Position: Sitting, Cuff Size: Normal)   Pulse 83   Temp 98.2 F (36.8 C) (Oral)   Ht 5\' 2"  (1.575 m)   Wt 167 lb 6.4 oz (75.9 kg)   BMI 30.62 kg/m    Subjective:    Patient ID: Krista Thomas, female    DOB: 1966/02/28, 50 y.o.   MRN: [id number]  Krista Thomas is a 50 y.o. female presenting on 07/26/2016 for Depression (Prozac is not working ) Patient here to be established as new patient at [facility].  This patient is known to me from [facility].   HPI this patient comes in today for recheck on her depression. She has been having a severe depression for 8 months now. Her son was killed at gunpoint in some type of argument. The legal battle has not fully been resolved. In addition the grandchild was born after the son's death but now the biological mother has left the area with the child. This is causing her more distress. She has not been able to keep work. She is on her third job. She has very poor concentration and mood. She is very tearful every day. She tried to go to [facility] in [place] but could not attend "group" for this problem. She needed one-on-one counseling. I've given her a list of counselors today and encouraged her to pursue this diligently. She has tried several medications in the past. We have discussed the fact that she has not tried Paxil or Cymbalta. 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 We have discussed risperidone and what it can do to help with depression and with mood instability. She is agreeable to try the medication today. We will start this medication and plan to recheck her back in 4 weeks.   Relevant past medical, surgical, family and social history reviewed and updated as indicated. Interim medical history since our last visit reviewed. Allergies  and medications reviewed and updated.   Data reviewed from any sources in EPIC.  Review of Systems  Constitutional: Negative.  Negative for activity change, fatigue and fever.  HENT: Negative.   Eyes: Negative.   Respiratory: Negative.  Negative for cough.   Cardiovascular: Negative.  Negative for chest pain.  Gastrointestinal: Negative.  Negative for abdominal pain.  Endocrine: Negative.   Genitourinary: Negative.  Negative for dysuria.  Musculoskeletal: Negative.   Skin: Negative.   Neurological: Negative.   Psychiatric/Behavioral: Positive for agitation, decreased concentration, dysphoric mood and sleep disturbance. Negative for self-injury and suicidal ideas. The patient is nervous/anxious.     Per HPI unless specifically indicated above  Social History   Social History  . Marital status: Widowed    Spouse name: N/A  . Number of children: N/A  . Years of education: N/A   Occupational History  . Not on file.   Social History Main Topics  . Smoking status: Current Every Day Smoker  . Smokeless tobacco: Never Used  . Alcohol use No  . Drug use: No  . Sexual activity: Yes   Other Topics Concern  . Not on file   Social History Narrative  . No narrative on file    Past Surgical History:  Procedure Laterality Date  . DEBRIDEMENT MANDIBLE  12/15/2012   Procedure: DEBRIDEMENT MANDIBLE;  Surgeon: Francene Finders, DDS;  Location:  [facility] OR;  Service: Oral Surgery;  Laterality: Right;  Dental Extractions with I&D of right sided submandibular space infection.  . TOOTH EXTRACTION  12/15/2012   Procedure: DENTAL RESTORATION/EXTRACTIONS;  Surgeon: Francene Findershristopher L , DDS;  [facility];  Service: Oral Surgery;  Laterality: Right;    Family History  Problem Relation Age of Onset  . Atrial fibrillation Mother   . Hypotension Father       Medication List       Accurate as of 07/26/16 11:36 AM. Always use your most recent med list.          ALPRAZolam 1 MG  tablet Commonly known as:  XANAX Take 1 mg by mouth 3 (three) times daily as needed. anxiety   PARoxetine 10 MG tablet Commonly known as:  PAXIL Take 1 tablet (10 mg total) by mouth daily.   risperiDONE 0.5 MG tablet Commonly known as:  RISPERDAL Take 2 tablets (1 mg total) by mouth at bedtime.   traZODone 100 MG tablet Commonly known as:  DESYREL Take 100 mg by mouth at bedtime.          Objective:    BP 112/80 (BP Location: Left Arm, Patient Position: Sitting, Cuff Size: Normal)   Pulse 83   Temp 98.2 F (36.8 C) (Oral)   Ht 5\' 2"  (1.575 m)   Wt 167 lb 6.4 oz (75.9 kg)   BMI 30.62 kg/m   No Known Allergies Wt Readings from Last 3 Encounters:  07/26/16 167 lb 6.4 oz (75.9 kg)  12/13/12 150 lb (68 kg)    Physical Exam  Constitutional: She is oriented to person, place, and time. She appears well-developed and well-nourished.  HENT:  Head: Normocephalic and atraumatic.  Eyes: Conjunctivae and EOM are normal. Pupils are equal, round, and reactive to light.  Cardiovascular: Normal rate, regular rhythm, normal heart sounds and intact distal pulses.   Pulmonary/Chest: Effort normal and breath sounds normal.  Neurological: She is alert and oriented to person, place, and time. She has normal reflexes.  Skin: Skin is warm and dry. No rash noted.  Psychiatric: Judgment and thought content normal. Her mood appears anxious. Her affect is angry. Her affect is not inappropriate. Her speech is not rapid and/or pressured and not slurred. She is withdrawn. Thought content is not paranoid and not delusional. Cognition and memory are impaired. She does not express impulsivity or inappropriate judgment. She exhibits a depressed mood. She expresses no homicidal and no suicidal ideation. She expresses no suicidal plans and no homicidal plans.  Nursing note and vitals reviewed.   Results for orders placed or performed during the hospital encounter of 12/13/12  Culture, blood (routine x 2)   Result Value Ref Range   Specimen Description BLOOD LEFT ARM    Special Requests BOTTLES DRAWN AEROBIC AND ANAEROBIC 10CC    Culture  Setup Time 12/14/2012 01:42    Culture NO GROWTH 5 DAYS    Report Status 12/20/2012 FINAL   Culture, blood (routine x 2)  Result Value Ref Range   Specimen Description BLOOD LEFT HAND    Special Requests BOTTLES DRAWN AEROBIC AND ANAEROBIC 10CC    Culture  Setup Time 12/14/2012 01:42    Culture NO GROWTH 5 DAYS    Report Status 12/20/2012 FINAL   Surgical pcr screen  Result Value Ref Range   MRSA, PCR NEGATIVE NEGATIVE   Staphylococcus aureus NEGATIVE NEGATIVE  Basic metabolic panel  Result Value Ref Range   Sodium 133 (L) 135 -  145 mEq/L   Potassium 4.0 3.5 - 5.1 mEq/L   Chloride 99 96 - 112 mEq/L   CO2 26 19 - 32 mEq/L   Glucose, Bld 86 70 - 99 mg/dL   BUN 9 6 - 23 mg/dL   Creatinine, Ser 4.09 0.50 - 1.10 mg/dL   Calcium 8.8 8.4 - 81.1 mg/dL   GFR calc non Af Amer 85 (L) >90 mL/min   GFR calc Af Amer >90 >90 mL/min  CBC with Differential  Result Value Ref Range   WBC 12.4 (H) 4.0 - 10.5 K/uL   RBC 4.19 3.87 - 5.11 MIL/uL   Hemoglobin 13.4 12.0 - 15.0 g/dL   HCT 91.4 78.2 - 95.6 %   MCV 92.6 78.0 - 100.0 fL   MCH 32.0 26.0 - 34.0 pg   MCHC 34.5 30.0 - 36.0 g/dL   RDW 21.3 08.6 - 57.8 %   Platelets 347 150 - 400 K/uL   Neutrophils Relative % 73 43 - 77 %   Neutro Abs 9.0 (H) 1.7 - 7.7 K/uL   Lymphocytes Relative 14 12 - 46 %   Lymphs Abs 1.8 0.7 - 4.0 K/uL   Monocytes Relative 11 3 - 12 %   Monocytes Absolute 1.3 (H) 0.1 - 1.0 K/uL   Eosinophils Relative 2 0 - 5 %   Eosinophils Absolute 0.3 0.0 - 0.7 K/uL   Basophils Relative 0 0 - 1 %   Basophils Absolute 0.0 0.0 - 0.1 K/uL  Basic metabolic panel  Result Value Ref Range   Sodium 138 135 - 145 mEq/L   Potassium 3.8 3.5 - 5.1 mEq/L   Chloride 104 96 - 112 mEq/L   CO2 23 19 - 32 mEq/L   Glucose, Bld 85 70 - 99 mg/dL   BUN 6 6 - 23 mg/dL   Creatinine, Ser 4.69 0.50 - 1.10 mg/dL    Calcium 8.1 (L) 8.4 - 10.5 mg/dL   GFR calc non Af Amer >90 >90 mL/min   GFR calc Af Amer >90 >90 mL/min  CBC  Result Value Ref Range   WBC 7.3 4.0 - 10.5 K/uL   RBC 3.66 (L) 3.87 - 5.11 MIL/uL   Hemoglobin 11.4 (L) 12.0 - 15.0 g/dL   HCT 62.9 (L) 52.8 - 41.3 %   MCV 92.3 78.0 - 100.0 fL   MCH 31.1 26.0 - 34.0 pg   MCHC 33.7 30.0 - 36.0 g/dL   RDW 24.4 01.0 - 27.2 %   Platelets 305 150 - 400 K/uL      Assessment & Plan:   1. Depression Medication change - PARoxetine (PAXIL) 10 MG tablet; Take 1 tablet (10 mg total) by mouth daily.  Dispense: 30 tablet; Refill: 1 - risperiDONE (RISPERDAL) 0.5 MG tablet; Take 2 tablets (1 mg total) by mouth at bedtime.  Dispense: 60 tablet; Refill: 1  2. Episodic mood disorder (HCC) - risperiDONE (RISPERDAL) 0.5 MG tablet; Take 2 tablets (1 mg total) by mouth at bedtime.  Dispense: 60 tablet; Refill: 1  3. Grief reaction Seek counseling for this and her depression  Continue all other medications listed above/  Follow up plan: Return in about 4 weeks (around 08/23/2016) for recheck meds.  Remus Loffler PA-C [facility] [address]  [place], [place] [address] [phone]   07/26/2016, 11:36 AM

## 2016-07-26 NOTE — Patient Instructions (Signed)
Stress and Stress Management Stress is a normal reaction to life events. It is what you feel when life demands more than you are used to or more than you can handle. Some stress can be useful. For example, the stress reaction can help you catch the last bus of the day, study for a test, or meet a deadline at work. But stress that occurs too often or for too long can cause problems. It can affect your emotional health and interfere with relationships and normal daily activities. Too much stress can weaken your immune system and increase your risk for physical illness. If you already have a medical problem, stress can make it worse. CAUSES  All sorts of life events may cause stress. An event that causes stress for one person may not be stressful for another person. Major life events commonly cause stress. These may be positive or negative. Examples include losing your job, moving into a new home, getting married, having a baby, or losing a loved one. Less obvious life events may also cause stress, especially if they occur day after day or in combination. Examples include working long hours, driving in traffic, caring for children, being in debt, or being in a difficult relationship. SIGNS AND SYMPTOMS Stress may cause emotional symptoms including, the following:  Anxiety. This is feeling worried, afraid, on edge, overwhelmed, or out of control.  Anger. This is feeling irritated or impatient.  Depression. This is feeling sad, down, helpless, or guilty.  Difficulty focusing, remembering, or making decisions. Stress may cause physical symptoms, including the following:   Aches and pains. These may affect your head, neck, back, stomach, or other areas of your body.  Tight muscles or clenched jaw.  Low energy or trouble sleeping. Stress may cause unhealthy behaviors, including the following:   Eating to feel better (overeating) or skipping meals.  Sleeping too little, too much, or both.  Working  too much or putting off tasks (procrastination).  Smoking, drinking alcohol, or using drugs to feel better. DIAGNOSIS  Stress is diagnosed through an assessment by your health care provider. Your health care provider will ask questions about your symptoms and any stressful life events.Your health care provider will also ask about your medical history and may order blood tests or other tests. Certain medical conditions and medicine can cause physical symptoms similar to stress. Mental illness can cause emotional symptoms and unhealthy behaviors similar to stress. Your health care provider may refer you to a mental health professional for further evaluation.  TREATMENT  Stress management is the recommended treatment for stress.The goals of stress management are reducing stressful life events and coping with stress in healthy ways.  Techniques for reducing stressful life events include the following:  Stress identification. Self-monitor for stress and identify what causes stress for you. These skills may help you to avoid some stressful events.  Time management. Set your priorities, keep a calendar of events, and learn to say "no." These tools can help you avoid making too many commitments. Techniques for coping with stress include the following:  Rethinking the problem. Try to think realistically about stressful events rather than ignoring them or overreacting. Try to find the positives in a stressful situation rather than focusing on the negatives.  Exercise. Physical exercise can release both physical and emotional tension. The key is to find a form of exercise you enjoy and do it regularly.  Relaxation techniques. These relax the body and mind. Examples include yoga, meditation, tai chi, biofeedback, deep  breathing, progressive muscle relaxation, listening to music, being out in nature, journaling, and other hobbies. Again, the key is to find one or more that you enjoy and can do  regularly.  Healthy lifestyle. Eat a balanced diet, get plenty of sleep, and do not smoke. Avoid using alcohol or drugs to relax.  Strong support network. Spend time with family, friends, or other people you enjoy being around.Express your feelings and talk things over with someone you trust. Counseling or talktherapy with a mental health professional may be helpful if you are having difficulty managing stress on your own. Medicine is typically not recommended for the treatment of stress.Talk to your health care provider if you think you need medicine for symptoms of stress. HOME CARE INSTRUCTIONS  Keep all follow-up visits as directed by your health care provider.  Take all medicines as directed by your health care provider. SEEK MEDICAL CARE IF:  Your symptoms get worse or you start having new symptoms.  You feel overwhelmed by your problems and can no longer manage them on your own. SEEK IMMEDIATE MEDICAL CARE IF:  You feel like hurting yourself or someone else.   This information is not intended to replace advice given to you by your health care provider. Make sure you discuss any questions you have with your health care provider.   Document Released: 05/07/2001 Document Revised: 12/02/2014 Document Reviewed: 07/06/2013 Elsevier Interactive Patient Education 2016 Elsevier Inc.  

## 2016-08-27 ENCOUNTER — Ambulatory Visit: Payer: Self-pay | Admitting: Physician Assistant

## 2016-09-06 ENCOUNTER — Telehealth: Payer: Self-pay | Admitting: Physician Assistant

## 2016-09-06 DIAGNOSIS — F332 Major depressive disorder, recurrent severe without psychotic features: Secondary | ICD-10-CM

## 2016-09-06 DIAGNOSIS — F39 Unspecified mood [affective] disorder: Secondary | ICD-10-CM

## 2016-09-06 MED ORDER — RISPERIDONE 0.5 MG PO TABS: 1.0000 mg | ORAL_TABLET | Freq: Every day | ORAL | 2 refills | Status: DC

## 2016-09-06 NOTE — Telephone Encounter (Signed)
sent 

## 2016-09-06 NOTE — Telephone Encounter (Signed)
Please advise on refills and route to pool A

## 2016-09-09 ENCOUNTER — Ambulatory Visit: Payer: Self-pay | Admitting: Physician Assistant

## 2016-09-09 NOTE — Telephone Encounter (Signed)
Not available, VM full.

## 2016-09-09 NOTE — Telephone Encounter (Signed)
Pt aware refill sent to pharmacy 

## 2016-09-21 ENCOUNTER — Other Ambulatory Visit: Payer: Self-pay | Admitting: Physician Assistant

## 2016-09-21 DIAGNOSIS — F3341 Major depressive disorder, recurrent, in partial remission: Secondary | ICD-10-CM

## 2016-09-23 MED ORDER — PAROXETINE HCL 10 MG PO TABS: 10.0000 mg | ORAL_TABLET | Freq: Every day | ORAL | 1 refills | Status: DC

## 2016-09-23 NOTE — Telephone Encounter (Signed)
sent 

## 2016-09-23 NOTE — Telephone Encounter (Signed)
Patient aware.

## 2016-10-08 ENCOUNTER — Ambulatory Visit: Payer: Self-pay | Admitting: Physician Assistant

## 2016-10-09 ENCOUNTER — Encounter: Payer: Self-pay | Admitting: Physician Assistant

## 2016-10-09 ENCOUNTER — Telehealth: Payer: Self-pay | Admitting: Family Medicine

## 2016-10-18 ENCOUNTER — Encounter: Payer: Self-pay | Admitting: Family Medicine

## 2016-10-18 ENCOUNTER — Ambulatory Visit (INDEPENDENT_AMBULATORY_CARE_PROVIDER_SITE_OTHER): Payer: Self-pay | Admitting: Family Medicine

## 2016-10-18 VITALS — BP 110/72 | HR 79 | Temp 97.9°F | Ht 62.0 in | Wt 183.6 lb

## 2016-10-18 DIAGNOSIS — J069 Acute upper respiratory infection, unspecified: Secondary | ICD-10-CM

## 2016-10-18 MED ORDER — FLUTICASONE PROPIONATE 50 MCG/ACT NA SUSP: 1.0000 | Freq: Two times a day (BID) | NASAL | 6 refills | Status: AC | PRN

## 2016-10-18 MED ORDER — AZITHROMYCIN 250 MG PO TABS: ORAL_TABLET | ORAL | 0 refills | Status: DC

## 2016-10-18 NOTE — Progress Notes (Signed)
BP 110/72   Pulse 79   Temp 97.9 F (36.6 C) (Oral)   Ht 5\' 2"  (1.575 m)   Wt 183 lb 9.6 oz (83.3 kg)   BMI 33.58 kg/m    Subjective:    Patient ID: Krista Thomas, female    DOB: 15-Jun-1966, 50 y.o.   MRN: [id number]  HPI: Krista Thomas is a 50 y.o. female presenting on 10/18/2016 for Cough (sick for 3 days)   HPI Cough and sore throat and congestion Patient has been having cough and sore throat and congestion is been going on for the past 3 days. She did have a grandson that was ill with a similar illness where she may have gotten this from. She denies any fevers or chills but has had some body aches. She denies any shortness of breath or wheezing. Her cough is mostly nonproductive. Her sore throat is probably the thing that is bothering her the most. She has tried Mucinex and Tylenol without much success.  Relevant past medical, surgical, family and social history reviewed and updated as indicated. Interim medical history since our last visit reviewed. Allergies and medications reviewed and updated.  Review of Systems  Constitutional: Negative for chills and fever.  HENT: Positive for congestion, postnasal drip, rhinorrhea, sinus pressure, sneezing and sore throat. Negative for ear discharge and ear pain.   Eyes: Negative for pain, redness and visual disturbance.  Respiratory: Positive for cough. Negative for chest tightness and shortness of breath.   Cardiovascular: Negative for chest pain and leg swelling.  Genitourinary: Negative for difficulty urinating and dysuria.  Musculoskeletal: Positive for myalgias. Negative for back pain and gait problem.  Skin: Negative for rash.  Neurological: Negative for light-headedness and headaches.  Psychiatric/Behavioral: Negative for agitation and behavioral problems.  All other systems reviewed and are negative.   Per HPI unless specifically indicated above      Objective:    BP 110/72   Pulse 79   Temp 97.9 F (36.6 C) (Oral)    Ht 5\' 2"  (1.575 m)   Wt 183 lb 9.6 oz (83.3 kg)   BMI 33.58 kg/m   Wt Readings from Last 3 Encounters:  10/18/16 183 lb 9.6 oz (83.3 kg)  07/26/16 167 lb 6.4 oz (75.9 kg)  12/13/12 150 lb (68 kg)    Physical Exam  Constitutional: She is oriented to person, place, and time. She appears well-developed and well-nourished. No distress.  HENT:  Right Ear: Tympanic membrane, external ear and ear canal normal.  Left Ear: Tympanic membrane, external ear and ear canal normal.  Nose: Mucosal edema and rhinorrhea present. No epistaxis. Right sinus exhibits no maxillary sinus tenderness and no frontal sinus tenderness. Left sinus exhibits no maxillary sinus tenderness and no frontal sinus tenderness.  Mouth/Throat: Uvula is midline and mucous membranes are normal. Posterior oropharyngeal edema and posterior oropharyngeal erythema present. No oropharyngeal exudate or tonsillar abscesses.  Eyes: Conjunctivae and EOM are normal.  Cardiovascular: Normal rate, regular rhythm, normal heart sounds and intact distal pulses.   No murmur heard. Pulmonary/Chest: Effort normal and breath sounds normal. No respiratory distress. She has no wheezes.  Musculoskeletal: Normal range of motion. She exhibits no edema or tenderness.  Neurological: She is alert and oriented to person, place, and time. Coordination normal.  Skin: Skin is warm and dry. No rash noted. She is not diaphoretic.  Psychiatric: She has a normal mood and affect. Her behavior is normal.  Vitals reviewed.     Assessment & Plan:  Problem List Items Addressed This Visit    None    Visit Diagnoses    Acute upper respiratory infection    -  Primary   Recommend Flonase, Mucinex, antihistamine, and for 5 days if not improved the Antibiotic   Relevant Medications   azithromycin (ZITHROMAX) 250 MG tablet   fluticasone (FLONASE) 50 MCG/ACT nasal spray       Follow up plan: Return if symptoms worsen or fail to improve.  Counseling provided  for all of the vaccine components No orders of the defined types were placed in this encounter.   Makiyah Zentz, MD [facility] 10/18/2016, 10:49 AM

## 2016-10-24 ENCOUNTER — Telehealth: Payer: Self-pay | Admitting: Physician Assistant

## 2016-10-24 MED ORDER — BENZONATATE 200 MG PO CAPS: 200.0000 mg | ORAL_CAPSULE | Freq: Two times a day (BID) | ORAL | 0 refills | Status: DC | PRN

## 2016-10-24 NOTE — Telephone Encounter (Signed)
Aware, tessalon pearls script sent to pharmacy.

## 2016-10-24 NOTE — Telephone Encounter (Signed)
Please advise if medication for cough will be prescribed.

## 2016-10-25 NOTE — Telephone Encounter (Signed)
Several attempts made to contact patient this encounter will be closed.  

## 2016-11-13 ENCOUNTER — Other Ambulatory Visit: Payer: Self-pay

## 2016-11-13 DIAGNOSIS — F332 Major depressive disorder, recurrent severe without psychotic features: Secondary | ICD-10-CM

## 2016-11-13 DIAGNOSIS — F39 Unspecified mood [affective] disorder: Secondary | ICD-10-CM

## 2016-11-13 MED ORDER — ALPRAZOLAM 2 MG PO TABS: ORAL_TABLET | ORAL | 1 refills | Status: DC

## 2016-11-13 MED ORDER — RISPERIDONE 0.5 MG PO TABS: 1.0000 mg | ORAL_TABLET | Freq: Every day | ORAL | 2 refills | Status: DC

## 2016-11-13 NOTE — Telephone Encounter (Signed)
Alprazolam last filled on 11/05/16 for #90. Please advise and route to Pool A so nurse can call to [facility]

## 2016-11-13 NOTE — Telephone Encounter (Signed)
Patient last seen in office on 10-18-16 for an acute visit. Rx last filled on 11/08/16 for #60. Please advise

## 2016-11-14 ENCOUNTER — Other Ambulatory Visit: Payer: Self-pay | Admitting: Physician Assistant

## 2016-11-15 NOTE — Telephone Encounter (Signed)
This is actually an angel patient, she mainly saw me because angel doesn't have the insurance coverage yet. Please forward request angel. She will be back on Tuesday

## 2016-11-19 NOTE — Telephone Encounter (Signed)
Phoned in.

## 2016-11-21 ENCOUNTER — Encounter: Payer: Self-pay | Admitting: Pediatrics

## 2016-11-21 ENCOUNTER — Ambulatory Visit (INDEPENDENT_AMBULATORY_CARE_PROVIDER_SITE_OTHER): Payer: Self-pay | Admitting: Pediatrics

## 2016-11-21 VITALS — BP 104/74 | HR 102 | Temp 96.9°F | Ht 62.0 in | Wt 184.0 lb

## 2016-11-21 DIAGNOSIS — R059 Cough, unspecified: Secondary | ICD-10-CM

## 2016-11-21 DIAGNOSIS — R05 Cough: Secondary | ICD-10-CM

## 2016-11-21 DIAGNOSIS — J019 Acute sinusitis, unspecified: Secondary | ICD-10-CM

## 2016-11-21 MED ORDER — AMOXICILLIN-POT CLAVULANATE 875-125 MG PO TABS: 1.0000 | ORAL_TABLET | Freq: Two times a day (BID) | ORAL | 0 refills | Status: DC

## 2016-11-21 MED ORDER — GUAIFENESIN-CODEINE 100-10 MG/5ML PO SOLN: 5.0000 mL | Freq: Three times a day (TID) | ORAL | 0 refills | Status: DC | PRN

## 2016-11-21 NOTE — Patient Instructions (Signed)
Netipot with distilled water 2-3 times a day to clear out sinuses Or Normal saline nasal spray Flonase steroid nasal spray Cetirizine or similar antihistamine daily Ibuprofen 600mg  three times a day Lots of fluids

## 2016-11-21 NOTE — Progress Notes (Signed)
  Subjective:   Patient ID: Krista Thomas, female    DOB: [birth year], 50 y.o.   MRN: [id number] CC: Cough and Nasal Congestion  HPI: Krista Thomas is a 50 y.o. female presenting for Cough and Nasal Congestion  Sick for the past six weeks, will get better for a couple days then sick again Grandson 18mo has had several URIs during this same time Cough keeping her awake at night, not able to sleep Appetite has been ok Some subjective fevers  Relevant past medical, surgical, family and social history reviewed. Allergies and medications reviewed and updated. History  Smoking Status  . Current Every Day Smoker  Smokeless Tobacco  . Never Used   ROS: Per HPI   Objective:    BP 104/74   Pulse (!) 102   Temp (!) 96.9 F (36.1 C) (Oral)   Ht 5\' 2"  (1.575 m)   Wt 184 lb (83.5 kg)   BMI 33.65 kg/m   Wt Readings from Last 3 Encounters:  11/21/16 184 lb (83.5 kg)  10/18/16 183 lb 9.6 oz (83.3 kg)  07/26/16 167 lb 6.4 oz (75.9 kg)    Gen: NAD, alert, cooperative with exam, NCAT EYES: EOMI, no conjunctival injection, or no icterus ENT:  TMs dull, pink-yellow with effusion b/l, OP without erythema, TTP over max sinuses b/l LYMPH: no cervical LAD CV: NRRR, normal S1/S2, no murmur, distal pulses 2+ b/l Resp: CTABL, no wheezes, normal WOB Neuro: Alert and oriented MSK: normal muscle bulk  Assessment & Plan:  Krista Thomas was seen today for cough and nasal congestion.  Diagnoses and all orders for this visit:  Acute sinusitis, recurrence not specified, unspecified location Ongoing symptoms, no improvement after azithromycin 56 weeks ago Will treat for acute sinusitis given sx, exam -     amoxicillin-clavulanate (AUGMENTIN) 875-125 MG tablet; Take 1 tablet by mouth 2 (two) times daily.  Cough Do not take at same time as other medications that make you sleepy, keep out of reach of kids -     guaiFENesin-codeine 100-10 MG/5ML syrup; Take 5 mLs by mouth 3 (three) times daily as needed for  cough.   Follow up plan: prn Rex Krasarol Davison Ohms, MD [facility]

## 2016-12-03 ENCOUNTER — Encounter: Payer: Self-pay | Admitting: Physician Assistant

## 2016-12-03 ENCOUNTER — Ambulatory Visit (INDEPENDENT_AMBULATORY_CARE_PROVIDER_SITE_OTHER): Payer: Self-pay | Admitting: Physician Assistant

## 2016-12-03 VITALS — BP 102/72 | HR 84 | Temp 97.2°F | Ht 62.0 in | Wt 189.2 lb

## 2016-12-03 DIAGNOSIS — F411 Generalized anxiety disorder: Secondary | ICD-10-CM

## 2016-12-03 DIAGNOSIS — F39 Unspecified mood [affective] disorder: Secondary | ICD-10-CM

## 2016-12-03 DIAGNOSIS — F332 Major depressive disorder, recurrent severe without psychotic features: Secondary | ICD-10-CM

## 2016-12-03 DIAGNOSIS — F3341 Major depressive disorder, recurrent, in partial remission: Secondary | ICD-10-CM

## 2016-12-03 MED ORDER — ALPRAZOLAM 2 MG PO TABS: 1.0000 mg | ORAL_TABLET | Freq: Three times a day (TID) | ORAL | 5 refills | Status: DC | PRN

## 2016-12-03 MED ORDER — RISPERIDONE 1 MG PO TABS: 1.0000 mg | ORAL_TABLET | Freq: Every day | ORAL | 6 refills | Status: DC

## 2016-12-03 MED ORDER — PAROXETINE HCL 10 MG PO TABS: 10.0000 mg | ORAL_TABLET | Freq: Every day | ORAL | 6 refills | Status: DC

## 2016-12-03 NOTE — Progress Notes (Signed)
BP 102/72   Pulse 84   Temp 97.2 F (36.2 C) (Oral)   Ht 5\' 2"  (1.575 m)   Wt 189 lb 3.2 oz (85.8 kg)   BMI 34.61 kg/m    Subjective:    Patient ID: Krista LighterShelia Brathwaite, female    DOB: [birth year], 51 y.o.   MRN: [id number]  HPI: Krista Thomas is a 51 y.o. female presenting on 12/03/2016 for Medication Refill  This patient comes in for periodic recheck on medications and conditions. All medications are reviewed today. There are no reports of any problems with the medications. All of the medical conditions are reviewed and updated.  Lab work is reviewed and will be ordered as medically necessary. There are no new problems reported with today's visit.  She is coming up on one year from her son's murder. There is still not resolution in the courts. She does have custody of her grandson at this time. He has less than one year of age. Her medications are quite stable and doing well. We will continue them as she has been taking. She will call if there is any change in her symptoms. Recheck her in 6 months.  Relevant past medical, surgical, family and social history reviewed and updated as indicated. Allergies and medications reviewed and updated.  Past Medical History:  Diagnosis Date  . Depression     Past Surgical History:  Procedure Laterality Date  . DEBRIDEMENT MANDIBLE  12/15/2012   Procedure: DEBRIDEMENT MANDIBLE;  Surgeon: Francene Findershristopher L Sawyer, DDS;  [facility];  Service: Oral Surgery;  Laterality: Right;  Dental Extractions with I&D of right sided submandibular space infection.  . TOOTH EXTRACTION  12/15/2012   Procedure: DENTAL RESTORATION/EXTRACTIONS;  Surgeon: Francene Findershristopher L [place], DDS;  [facility];  Service: Oral Surgery;  Laterality: Right;    Review of Systems  Constitutional: Negative.  Negative for activity change, fatigue and fever.  HENT: Negative.   Eyes: Negative.   Respiratory: Negative.  Negative for cough.   Cardiovascular: Negative.  Negative for chest  pain.  Gastrointestinal: Negative.  Negative for abdominal pain.  Endocrine: Negative.   Genitourinary: Negative.  Negative for dysuria.  Musculoskeletal: Negative.   Skin: Negative.   Neurological: Negative.     Allergies as of 12/03/2016   No Known Allergies     Medication List       Accurate as of 12/03/16 12:04 PM. Always use your most recent med list.          alprazolam 2 MG tablet Commonly known as:  XANAX Take 0.5 tablets (1 mg total) by mouth 3 (three) times daily as needed for sleep or anxiety.   fluticasone 50 MCG/ACT nasal spray Commonly known as:  FLONASE Place 1 spray into both nostrils 2 (two) times daily as needed for allergies or rhinitis.   PARoxetine 10 MG tablet Commonly known as:  PAXIL Take 1 tablet (10 mg total) by mouth daily.   risperiDONE 1 MG tablet Commonly known as:  RISPERDAL Take 1-2 tablets (1-2 mg total) by mouth at bedtime.   traZODone 100 MG tablet Commonly known as:  DESYREL Take 100 mg by mouth at bedtime.          Objective:    BP 102/72   Pulse 84   Temp 97.2 F (36.2 C) (Oral)   Ht 5\' 2"  (1.575 m)   Wt 189 lb 3.2 oz (85.8 kg)   BMI 34.61 kg/m   No Known Allergies  Physical Exam  Constitutional: She is oriented to person, place, and time. She appears well-developed and well-nourished.  HENT:  Head: Normocephalic and atraumatic.  Eyes: Conjunctivae and EOM are normal. Pupils are equal, round, and reactive to light.  Cardiovascular: Normal rate, regular rhythm, normal heart sounds and intact distal pulses.   Pulmonary/Chest: Effort normal and breath sounds normal.  Abdominal: Soft. Bowel sounds are normal.  Neurological: She is alert and oriented to person, place, and time. She has normal reflexes.  Skin: Skin is warm and dry. No rash noted.  Psychiatric: She has a normal mood and affect. Her behavior is normal. Judgment and thought content normal.        Assessment & Plan:   1. Severe episode of recurrent  major depressive disorder, without psychotic features (HCC) - risperiDONE (RISPERDAL) 1 MG tablet; Take 1-2 tablets (1-2 mg total) by mouth at bedtime.  Dispense: 60 tablet; Refill: 6  2. Episodic mood disorder (HCC) - risperiDONE (RISPERDAL) 1 MG tablet; Take 1-2 tablets (1-2 mg total) by mouth at bedtime.  Dispense: 60 tablet; Refill: 6 - alprazolam (XANAX) 2 MG tablet; Take 0.5 tablets (1 mg total) by mouth 3 (three) times daily as needed for sleep or anxiety.  Dispense: 90 tablet; Refill: 5  3. Recurrent major depressive disorder, in partial remission (HCC) - PARoxetine (PAXIL) 10 MG tablet; Take 1 tablet (10 mg total) by mouth daily.  Dispense: 30 tablet; Refill: 6  4. Generalized anxiety disorder - alprazolam (XANAX) 2 MG tablet; Take 0.5 tablets (1 mg total) by mouth 3 (three) times daily as needed for sleep or anxiety.  Dispense: 90 tablet; Refill: 5   Continue all other maintenance medications as listed above.  Follow up plan: Return in about 6 months (around 06/02/2017).  No orders of the defined types were placed in this encounter.   Educational handout given for anxiety  Remus Loffler PA-C [facility] [address]  [place], [place] [address] [phone]   12/03/2016, 12:04 PM

## 2016-12-03 NOTE — Patient Instructions (Signed)
Complicated Grieving Introduction Grief is a normal response to the death of someone close to you. Feelings of fear, anger, and guilt can affect almost everyone who loses a loved one. It is also common to have symptoms of depression while you are grieving. These include problems with sleep, loss of appetite, and lack of energy. They may last for weeks or months after a loss. Complicated grief is different from normal grief or depression. Normal grieving involves sadness and feelings of loss, but these feelings are not constant. Complicated grief is a constant and severe type of grief. It interferes with your ability to function normally. It may last for several months to a year or longer. Complicated grief may require treatment from a mental health care provider. What are the causes? It is not known why some people continue to struggle with grief and others do not. You may be at higher risk for complicated grief if:  The death of your loved one was sudden or unexpected.  The death of your loved one was due to a violent event.  Your loved one committed suicide.  Your loved one was a child or a young person.  You were very close to or dependent on the loved one.  You have a history of depression. What are the signs or symptoms? Signs and symptoms of complicated grief may include:  Feeling disbelief or numbness.  Being unable to enjoy good memories of your loved one.  Needing to avoid anything that reminds you of your loved one.  Being unable to stop thinking about the death.  Feeling intense anger or guilt.  Feeling alone and hopeless.  Feeling that your life is meaningless and empty.  Losing the desire to live. How is this diagnosed? Your health care provider may diagnose complicated grief if:  You have constant symptoms of grief for 6-12 months or longer.  Your symptoms are interfering with your ability to live your life. Your health care provider may want you to see a  mental health care provider. Many symptoms of depression are similar to the symptoms of complicated grief. It is important to be evaluated for complicated grief along with other mental health conditions. How is this treated? Talk therapy with a mental health provider is the most common treatment for complicated grief. During therapy, you will learn healthy ways to cope with the loss of your loved one. In some cases, your mental health care provider may also recommend antidepressant medicines. Follow these instructions at home:  Take care of yourself.  Eat regular meals and maintain a healthy diet. Eat plenty of fruits, vegetables, and whole grains.  Try to get some exercise each day.  Keep regular hours for sleep. Try to get at least 8 hours of sleep each night.  Do not use drugs or alcohol to ease your symptoms.  Take medicines only as directed by your health care provider.  Spend time with friends and loved ones.  Consider joining a grief (bereavement) support group to help you deal with your loss.  Keep all follow-up visits as directed by your health care provider. This is important. Contact a health care provider if:  Your symptoms keep you from functioning normally.  Your symptoms do not get better with treatment. Get help right away if:  You have serious thoughts of hurting yourself or someone else.  You have suicidal feelings. This information is not intended to replace advice given to you by your health care provider. Make sure you discuss any   questions you have with your health care provider. Document Released: 11/11/2005 Document Revised: 04/18/2016 Document Reviewed: 04/21/2014  2017 Elsevier  

## 2016-12-06 ENCOUNTER — Ambulatory Visit: Payer: Self-pay | Admitting: Physician Assistant

## 2017-01-29 ENCOUNTER — Other Ambulatory Visit: Payer: Self-pay | Admitting: Physician Assistant

## 2017-01-29 ENCOUNTER — Telehealth: Payer: Self-pay | Admitting: Physician Assistant

## 2017-01-29 DIAGNOSIS — F411 Generalized anxiety disorder: Secondary | ICD-10-CM

## 2017-01-29 DIAGNOSIS — F39 Unspecified mood [affective] disorder: Secondary | ICD-10-CM

## 2017-01-29 NOTE — Telephone Encounter (Signed)
What is the name of the medication? xanax  Have you contacted your pharmacy to request a refill?  Which pharmacy would you like this sent to? [facility]   Patient notified that their request is being sent to the clinical staff for review and that they should receive a call once it is complete. If they do not receive a call within 24 hours they can check with their pharmacy or our office.

## 2017-01-29 NOTE — Telephone Encounter (Signed)
I did the script incorrectly, can she bring it back in?  New script can be called in for xanax 2 mg one tab TID for anxiety, #90, #5 RF

## 2017-01-29 NOTE — Telephone Encounter (Signed)
Aware. She has refills at the pharmacy.

## 2017-01-29 NOTE — Telephone Encounter (Signed)
Patient did not get her xanax rerfill in January and was getting refill from older script.   Script done in January is for .5 dose and patient wants to know why you lowered the dose?

## 2017-05-05 ENCOUNTER — Encounter: Payer: Self-pay | Admitting: Physician Assistant

## 2017-05-05 ENCOUNTER — Ambulatory Visit (INDEPENDENT_AMBULATORY_CARE_PROVIDER_SITE_OTHER): Payer: Self-pay | Admitting: Physician Assistant

## 2017-05-05 DIAGNOSIS — F411 Generalized anxiety disorder: Secondary | ICD-10-CM

## 2017-05-05 DIAGNOSIS — F332 Major depressive disorder, recurrent severe without psychotic features: Secondary | ICD-10-CM

## 2017-05-05 DIAGNOSIS — F39 Unspecified mood [affective] disorder: Secondary | ICD-10-CM

## 2017-05-05 MED ORDER — RISPERIDONE 1 MG PO TABS: 1.0000 mg | ORAL_TABLET | Freq: Every day | ORAL | 6 refills | Status: DC

## 2017-05-05 MED ORDER — VENLAFAXINE HCL ER 150 MG PO CP24: 150.0000 mg | ORAL_CAPSULE | Freq: Every day | ORAL | 2 refills | Status: DC

## 2017-05-05 MED ORDER — ALPRAZOLAM 2 MG PO TABS: 2.0000 mg | ORAL_TABLET | Freq: Three times a day (TID) | ORAL | 1 refills | Status: DC

## 2017-05-05 MED ORDER — TRAZODONE HCL 100 MG PO TABS: 100.0000 mg | ORAL_TABLET | Freq: Every day | ORAL | 11 refills | Status: DC

## 2017-05-05 NOTE — Progress Notes (Signed)
BP 107/77   Pulse 85   Temp 97.6 F (36.4 C) (Oral)   Ht 5\' 2"  (1.575 m)   Wt 198 lb (89.8 kg)   LMP 02/02/2017 (Approximate)   BMI 36.21 kg/m    Subjective:    Patient ID: Krista Thomas, female    DOB: 16-May-1966, 51 y.o.   MRN: [id number]  HPI: Krista Thomas is a 51 y.o. female presenting on 05/05/2017 for Depression (pt here today stating her depression meds aren't working because she is having "crazy thoughts" such as thinking about killing the person who killed her son, has had thoughts of killing herself which she then stated she would never do because she would be too scared. She also states she is very irritable and doesn't want to be around anyone.)  She is still grieving deeply over her son who was shot a murdered. A couple of weeks ago she received a call from the [facility]. [facility] Stating that there was new evidence. However they could not disclose the information. It has her extremely upset.  Relevant past medical, surgical, family and social history reviewed and updated as indicated. Allergies and medications reviewed and updated.  Past Medical History:  Diagnosis Date  . Depression     Past Surgical History:  Procedure Laterality Date  . DEBRIDEMENT MANDIBLE  12/15/2012   Procedure: DEBRIDEMENT MANDIBLE;  Surgeon: Francene Finders, DDS;  Location: [facility] OR;  Service: Oral Surgery;  Laterality: Right;  Dental Extractions with I&D of right sided submandibular space infection.  . TOOTH EXTRACTION  12/15/2012   Procedure: DENTAL RESTORATION/EXTRACTIONS;  Surgeon: Francene Finders, DDS;  Location: [facility] OR;  Service: Oral Surgery;  Laterality: Right;    Review of Systems  Constitutional: Negative.  Negative for activity change, fatigue and fever.  HENT: Negative.   Eyes: Negative.   Respiratory: Negative.  Negative for cough.   Cardiovascular: Negative.  Negative for chest pain.  Gastrointestinal: Negative.  Negative for abdominal pain.  Endocrine: Negative.   Genitourinary:  Negative.  Negative for dysuria.  Musculoskeletal: Negative.   Skin: Negative.   Neurological: Negative.   Psychiatric/Behavioral: Positive for dysphoric mood, hallucinations and sleep disturbance. Negative for suicidal ideas. The patient is nervous/anxious.     Allergies as of 05/05/2017   No Known Allergies     Medication List       Accurate as of 05/05/17  8:47 PM. Always use your most recent med list.          alprazolam 2 MG tablet Commonly known as:  XANAX Take 1 tablet (2 mg total) by mouth 3 (three) times daily.   fluticasone 50 MCG/ACT nasal spray Commonly known as:  FLONASE Place 1 spray into both nostrils 2 (two) times daily as needed for allergies or rhinitis.   risperiDONE 1 MG tablet Commonly known as:  RISPERDAL Take 1-2 tablets (1-2 mg total) by mouth at bedtime.   traZODone 100 MG tablet Commonly known as:  DESYREL Take 1 tablet (100 mg total) by mouth at bedtime.   venlafaxine XR 150 MG 24 hr capsule Commonly known as:  EFFEXOR-XR Take 1 capsule (150 mg total) by mouth daily with breakfast.          Objective:    BP 107/77   Pulse 85   Temp 97.6 F (36.4 C) (Oral)   Ht 5\' 2"  (1.575 m)   Wt 198 lb (89.8 kg)   LMP 02/02/2017 (Approximate)   BMI 36.21 kg/m   No Known Allergies  Physical Exam  Constitutional: She is oriented to person, place, and time. She appears well-developed and well-nourished.  HENT:  Head: Normocephalic and atraumatic.  Eyes: Conjunctivae and EOM are normal. Pupils are equal, round, and reactive to light.  Cardiovascular: Normal rate, regular rhythm, normal heart sounds and intact distal pulses.   Pulmonary/Chest: Effort normal and breath sounds normal.  Abdominal: Soft. Bowel sounds are normal.  Neurological: She is alert and oriented to person, place, and time. She has normal reflexes.  Skin: Skin is warm and dry. No rash noted.  Psychiatric: Her behavior is normal. Judgment and thought content normal. Her mood  appears anxious. Her affect is not inappropriate. Her speech is not rapid and/or pressured. She exhibits a depressed mood.       Assessment & Plan:   1. Severe episode of recurrent major depressive disorder, without psychotic features (HCC) - venlafaxine XR (EFFEXOR-XR) 150 MG 24 hr capsule; Take 1 capsule (150 mg total) by mouth daily with breakfast.  Dispense: 30 capsule; Refill: 2 - risperiDONE (RISPERDAL) 1 MG tablet; Take 1-2 tablets (1-2 mg total) by mouth at bedtime.  Dispense: 60 tablet; Refill: 6 - traZODone (DESYREL) 100 MG tablet; Take 1 tablet (100 mg total) by mouth at bedtime.  Dispense: 30 tablet; Refill: 11  2. Episodic mood disorder (HCC) - risperiDONE (RISPERDAL) 1 MG tablet; Take 1-2 tablets (1-2 mg total) by mouth at bedtime.  Dispense: 60 tablet; Refill: 6 - alprazolam (XANAX) 2 MG tablet; Take 1 tablet (2 mg total) by mouth 3 (three) times daily.  Dispense: 90 tablet; Refill: 1  3. Generalized anxiety disorder - alprazolam (XANAX) 2 MG tablet; Take 1 tablet (2 mg total) by mouth 3 (three) times daily.  Dispense: 90 tablet; Refill: 1   Current Outpatient Prescriptions:  .  alprazolam (XANAX) 2 MG tablet, Take 1 tablet (2 mg total) by mouth 3 (three) times daily., Disp: 90 tablet, Rfl: 1 .  fluticasone (FLONASE) 50 MCG/ACT nasal spray, Place 1 spray into both nostrils 2 (two) times daily as needed for allergies or rhinitis., Disp: 16 g, Rfl: 6 .  risperiDONE (RISPERDAL) 1 MG tablet, Take 1-2 tablets (1-2 mg total) by mouth at bedtime., Disp: 60 tablet, Rfl: 6 .  traZODone (DESYREL) 100 MG tablet, Take 1 tablet (100 mg total) by mouth at bedtime., Disp: 30 tablet, Rfl: 11 .  venlafaxine XR (EFFEXOR-XR) 150 MG 24 hr capsule, Take 1 capsule (150 mg total) by mouth daily with breakfast., Disp: 30 capsule, Rfl: 2  Continue all other maintenance medications as listed above.  Follow up plan: Return in about 2 months (around 07/05/2017) for recheck.  Educational handout  given for grief reaction  Remus LofflerAngel S. Yuvraj Pfeifer PA-C [facility] [address]  [place], [place] [address] [phone]   05/05/2017, 8:47 PM

## 2017-05-05 NOTE — Patient Instructions (Signed)

## 2017-06-03 ENCOUNTER — Ambulatory Visit: Payer: Self-pay | Admitting: Physician Assistant

## 2017-06-30 ENCOUNTER — Other Ambulatory Visit: Payer: Self-pay | Admitting: Physician Assistant

## 2017-06-30 DIAGNOSIS — F332 Major depressive disorder, recurrent severe without psychotic features: Secondary | ICD-10-CM

## 2017-07-01 ENCOUNTER — Encounter: Payer: Self-pay | Admitting: Physician Assistant

## 2017-07-01 ENCOUNTER — Ambulatory Visit (INDEPENDENT_AMBULATORY_CARE_PROVIDER_SITE_OTHER): Payer: Self-pay | Admitting: Physician Assistant

## 2017-07-01 DIAGNOSIS — F411 Generalized anxiety disorder: Secondary | ICD-10-CM

## 2017-07-01 DIAGNOSIS — F332 Major depressive disorder, recurrent severe without psychotic features: Secondary | ICD-10-CM

## 2017-07-01 DIAGNOSIS — F39 Unspecified mood [affective] disorder: Secondary | ICD-10-CM

## 2017-07-01 MED ORDER — RISPERIDONE 1 MG PO TABS: 1.0000 mg | ORAL_TABLET | Freq: Every day | ORAL | 6 refills | Status: DC

## 2017-07-01 MED ORDER — ALPRAZOLAM 2 MG PO TABS: 2.0000 mg | ORAL_TABLET | Freq: Three times a day (TID) | ORAL | 5 refills | Status: DC

## 2017-07-01 MED ORDER — VENLAFAXINE HCL ER 150 MG PO CP24: ORAL_CAPSULE | ORAL | 11 refills | Status: DC

## 2017-07-01 NOTE — Patient Instructions (Signed)
In a few days you may receive a survey in the mail or online from [employer] regarding your visit with us today. Please take a moment to fill this out. Your feedback is very important to our whole office. It can help us better understand your needs as well as improve your experience and satisfaction. Thank you for taking your time to complete it. We care about you.  Lismary Kiehn, PA-C  

## 2017-07-01 NOTE — Progress Notes (Signed)
BP 106/75   Pulse 91   Temp 98 F (36.7 C) (Oral)   Ht 5\' 2"  (1.575 m)   Wt 198 lb (89.8 kg)   BMI 36.21 kg/m    Subjective:    Patient ID: Krista Thomas, female    DOB: 04-14-1966, 51 y.o.   MRN: [id number]  HPI: Krista Thomas is a 51 y.o. female presenting on 07/01/2017 for Follow-up (6 month ) and Medication Refill  This patient comes in for periodic recheck on medications and conditions including depression, Mood disorder and anxiety. She is quite stable at this time. She does have custody of her grandson at this point. Her son was killed a couple years ago. The mother lost him due to neglect. She is very happy with her grandson living with her. She states she is quite tired and having a baby of this age. Her depression and anxiety are overall doing well she does need refills on her medications. We'll plan to recheck her in 6 months.   All medications are reviewed today. There are no reports of any problems with the medications. All of the medical conditions are reviewed and updated.  Lab work is reviewed and will be ordered as medically necessary. There are no new problems reported with today's visit.   Relevant past medical, surgical, family and social history reviewed and updated as indicated. Allergies and medications reviewed and updated.  Past Medical History:  Diagnosis Date  . Depression     Past Surgical History:  Procedure Laterality Date  . DEBRIDEMENT MANDIBLE  12/15/2012   Procedure: DEBRIDEMENT MANDIBLE;  Surgeon: Francene Finders, DDS;  Location: [facility] OR;  Service: Oral Surgery;  Laterality: Right;  Dental Extractions with I&D of right sided submandibular space infection.  . TOOTH EXTRACTION  12/15/2012   Procedure: DENTAL RESTORATION/EXTRACTIONS;  Surgeon: Francene Finders, DDS;  Location: [facility] OR;  Service: Oral Surgery;  Laterality: Right;    Review of Systems  Constitutional: Negative.  Negative for activity change, fatigue and fever.  HENT: Negative.     Eyes: Negative.   Respiratory: Negative.  Negative for cough.   Cardiovascular: Negative.  Negative for chest pain.  Gastrointestinal: Negative.  Negative for abdominal pain.  Endocrine: Negative.   Genitourinary: Negative.  Negative for dysuria.  Musculoskeletal: Negative.   Skin: Negative.   Neurological: Negative.     Allergies as of 07/01/2017   No Known Allergies     Medication List       Accurate as of 07/01/17 11:35 AM. Always use your most recent med list.          alprazolam 2 MG tablet Commonly known as:  XANAX Take 1 tablet (2 mg total) by mouth 3 (three) times daily.   fluticasone 50 MCG/ACT nasal spray Commonly known as:  FLONASE Place 1 spray into both nostrils 2 (two) times daily as needed for allergies or rhinitis.   risperiDONE 1 MG tablet Commonly known as:  RISPERDAL Take 1-2 tablets (1-2 mg total) by mouth at bedtime.   traZODone 100 MG tablet Commonly known as:  DESYREL Take 1 tablet (100 mg total) by mouth at bedtime.   venlafaxine XR 150 MG 24 hr capsule Commonly known as:  EFFEXOR-XR Take 1 Capsule by mouth once daily with BREAKFAST          Objective:    BP 106/75   Pulse 91   Temp 98 F (36.7 C) (Oral)   Ht 5\' 2"  (1.575 m)   Wt  198 lb (89.8 kg)   BMI 36.21 kg/m   No Known Allergies  Physical Exam  Constitutional: She is oriented to person, place, and time. She appears well-developed and well-nourished.  HENT:  Head: Normocephalic and atraumatic.  Eyes: Pupils are equal, round, and reactive to light. Conjunctivae and EOM are normal.  Cardiovascular: Normal rate, regular rhythm, normal heart sounds and intact distal pulses.   Pulmonary/Chest: Effort normal and breath sounds normal.  Abdominal: Soft. Bowel sounds are normal.  Neurological: She is alert and oriented to person, place, and time. She has normal reflexes.  Skin: Skin is warm and dry. No rash noted.  Psychiatric: She has a normal mood and affect. Her behavior is  normal. Judgment and thought content normal.  Nursing note and vitals reviewed.       Assessment & Plan:   1. Severe episode of recurrent major depressive disorder, without psychotic features (HCC) - venlafaxine XR (EFFEXOR-XR) 150 MG 24 hr capsule; Take 1 Capsule by mouth once daily with BREAKFAST  Dispense: 30 capsule; Refill: 11 - risperiDONE (RISPERDAL) 1 MG tablet; Take 1-2 tablets (1-2 mg total) by mouth at bedtime.  Dispense: 60 tablet; Refill: 6  2. Episodic mood disorder (HCC) - risperiDONE (RISPERDAL) 1 MG tablet; Take 1-2 tablets (1-2 mg total) by mouth at bedtime.  Dispense: 60 tablet; Refill: 6 - alprazolam (XANAX) 2 MG tablet; Take 1 tablet (2 mg total) by mouth 3 (three) times daily.  Dispense: 90 tablet; Refill: 5  3. Generalized anxiety disorder - alprazolam (XANAX) 2 MG tablet; Take 1 tablet (2 mg total) by mouth 3 (three) times daily.  Dispense: 90 tablet; Refill: 5   Current Outpatient Prescriptions:  .  alprazolam (XANAX) 2 MG tablet, Take 1 tablet (2 mg total) by mouth 3 (three) times daily., Disp: 90 tablet, Rfl: 5 .  fluticasone (FLONASE) 50 MCG/ACT nasal spray, Place 1 spray into both nostrils 2 (two) times daily as needed for allergies or rhinitis., Disp: 16 g, Rfl: 6 .  risperiDONE (RISPERDAL) 1 MG tablet, Take 1-2 tablets (1-2 mg total) by mouth at bedtime., Disp: 60 tablet, Rfl: 6 .  traZODone (DESYREL) 100 MG tablet, Take 1 tablet (100 mg total) by mouth at bedtime., Disp: 30 tablet, Rfl: 11 .  venlafaxine XR (EFFEXOR-XR) 150 MG 24 hr capsule, Take 1 Capsule by mouth once daily with BREAKFAST, Disp: 30 capsule, Rfl: 11  Continue all other maintenance medications as listed above.  Follow up plan: Return in about 6 months (around 01/01/2018) for recheck.  Educational handout given for survey  Remus LofflerAngel S. Lilyrose Tanney PA-C [facility] [address]  [place], [place] [address] [phone]   07/01/2017, 11:35 AM

## 2017-07-03 ENCOUNTER — Other Ambulatory Visit: Payer: Self-pay | Admitting: Physician Assistant

## 2017-07-03 DIAGNOSIS — F411 Generalized anxiety disorder: Secondary | ICD-10-CM

## 2017-07-03 DIAGNOSIS — F39 Unspecified mood [affective] disorder: Secondary | ICD-10-CM

## 2017-07-04 ENCOUNTER — Ambulatory Visit: Payer: Self-pay | Admitting: Physician Assistant

## 2017-07-04 NOTE — Telephone Encounter (Signed)
Rx called in to pharmacy. 

## 2017-07-04 NOTE — Telephone Encounter (Signed)
Last seen 07/01/17  Lawanna KobusAngel  If approved route [facility]

## 2017-11-30 ENCOUNTER — Other Ambulatory Visit: Payer: Self-pay | Admitting: Physician Assistant

## 2017-11-30 DIAGNOSIS — F411 Generalized anxiety disorder: Secondary | ICD-10-CM

## 2017-11-30 DIAGNOSIS — F39 Unspecified mood [affective] disorder: Secondary | ICD-10-CM

## 2018-01-02 ENCOUNTER — Ambulatory Visit: Payer: Self-pay | Admitting: Physician Assistant

## 2018-01-02 ENCOUNTER — Encounter: Payer: Self-pay | Admitting: Physician Assistant

## 2018-01-02 VITALS — BP 117/80 | HR 103 | Temp 100.3°F | Ht 62.0 in | Wt 208.0 lb

## 2018-01-02 DIAGNOSIS — F411 Generalized anxiety disorder: Secondary | ICD-10-CM

## 2018-01-02 DIAGNOSIS — F39 Unspecified mood [affective] disorder: Secondary | ICD-10-CM

## 2018-01-02 DIAGNOSIS — J40 Bronchitis, not specified as acute or chronic: Secondary | ICD-10-CM

## 2018-01-02 MED ORDER — CITALOPRAM HYDROBROMIDE 20 MG PO TABS: 20.0000 mg | ORAL_TABLET | Freq: Every day | ORAL | 5 refills | Status: DC

## 2018-01-02 MED ORDER — AZITHROMYCIN 250 MG PO TABS: ORAL_TABLET | ORAL | 0 refills | Status: DC

## 2018-01-02 MED ORDER — ALPRAZOLAM 2 MG PO TABS: 2.0000 mg | ORAL_TABLET | Freq: Three times a day (TID) | ORAL | 5 refills | Status: DC

## 2018-01-02 NOTE — Patient Instructions (Signed)
In a few days you may receive a survey in the mail or online from [employer] regarding your visit with us today. Please take a moment to fill this out. Your feedback is very important to our whole office. It can help us better understand your needs as well as improve your experience and satisfaction. Thank you for taking your time to complete it. We care about you.  Madasyn Heath, PA-C  

## 2018-01-02 NOTE — Progress Notes (Signed)
BP 117/80   Pulse (!) 103   Temp 100.3 F (37.9 C) (Oral)   Ht 5\' 2"  (1.575 m)   Wt 208 lb (94.3 kg)   BMI 38.04 kg/m    Subjective:    Patient ID: Krista Thomas, female    DOB: [birth year], 52 y.o.   MRN: [id number]  HPI: Krista Thomas is a 52 y.o. female presenting on 01/02/2018 for Follow-up (medication refill)  This patient has had many days of sore throat and postnasal drainage, headache at times and sinus pressure. There is copious drainage at times. Denies any fever at this time. There has been a history of sinus infections in the past.  There is cough at night. It has become more prevalent in recent days.   Past Medical History:  Diagnosis Date  . Depression    Relevant past medical, surgical, family and social history reviewed and updated as indicated. Interim medical history since our last visit reviewed. Allergies and medications reviewed and updated. DATA REVIEWED: CHART IN EPIC  Family History reviewed for pertinent findings.  Review of Systems  Constitutional: Positive for chills and fatigue. Negative for activity change and appetite change.  HENT: Positive for congestion, postnasal drip and sore throat.   Eyes: Negative.   Respiratory: Positive for cough. Negative for wheezing.   Cardiovascular: Negative.  Negative for chest pain, palpitations and leg swelling.  Gastrointestinal: Negative.   Genitourinary: Negative.   Musculoskeletal: Negative.   Skin: Negative.   Neurological: Positive for headaches.  Psychiatric/Behavioral: Positive for decreased concentration.    Allergies as of 01/02/2018   No Known Allergies     Medication List        Accurate as of 01/02/18 11:17 AM. Always use your most recent med list.          alprazolam 2 MG tablet Commonly known as:  XANAX Take 1 tablet (2 mg total) by mouth 3 (three) times daily.   azithromycin 250 MG tablet Commonly known as:  ZITHROMAX Take as directed   citalopram 20 MG tablet Commonly known as:   CELEXA Take 1 tablet (20 mg total) by mouth daily.   fluticasone 50 MCG/ACT nasal spray Commonly known as:  FLONASE Place 1 spray into both nostrils 2 (two) times daily as needed for allergies or rhinitis.   risperiDONE 1 MG tablet Commonly known as:  RISPERDAL Take 1-2 tablets (1-2 mg total) by mouth at bedtime.   traZODone 100 MG tablet Commonly known as:  DESYREL Take 1 tablet (100 mg total) by mouth at bedtime.          Objective:    BP 117/80   Pulse (!) 103   Temp 100.3 F (37.9 C) (Oral)   Ht 5\' 2"  (1.575 m)   Wt 208 lb (94.3 kg)   BMI 38.04 kg/m   No Known Allergies  Wt Readings from Last 3 Encounters:  01/02/18 208 lb (94.3 kg)  07/01/17 198 lb (89.8 kg)  05/05/17 198 lb (89.8 kg)    Physical Exam  Constitutional: She is oriented to person, place, and time. She appears well-developed and well-nourished.  HENT:  Head: Normocephalic and atraumatic.  Right Ear: A middle ear effusion is present.  Left Ear: A middle ear effusion is present.  Nose: Mucosal edema present. Right sinus exhibits no frontal sinus tenderness. Left sinus exhibits no frontal sinus tenderness.  Mouth/Throat: Posterior oropharyngeal erythema present. No oropharyngeal exudate or tonsillar abscesses.  Eyes: Conjunctivae and EOM are normal. Pupils are equal,  round, and reactive to light.  Neck: Normal range of motion.  Cardiovascular: Normal rate, regular rhythm, normal heart sounds and intact distal pulses.  Pulmonary/Chest: Effort normal and breath sounds normal.  Abdominal: Soft. Bowel sounds are normal.  Neurological: She is alert and oriented to person, place, and time. She has normal reflexes.  Skin: Skin is warm and dry. No rash noted.  Psychiatric: She has a normal mood and affect. Her behavior is normal. Judgment and thought content normal.  Nursing note and vitals reviewed.       Assessment & Plan:   1. Episodic mood disorder (HCC) - alprazolam (XANAX) 2 MG tablet; Take 1  tablet (2 mg total) by mouth 3 (three) times daily.  Dispense: 90 tablet; Refill: 5  2. Generalized anxiety disorder - alprazolam (XANAX) 2 MG tablet; Take 1 tablet (2 mg total) by mouth 3 (three) times daily.  Dispense: 90 tablet; Refill: 5  3. Bronchitis   Continue all other maintenance medications as listed above.  Follow up plan: Return in about 6 months (around 07/02/2018) for recheck.  Educational handout given for survey  Remus LofflerAngel S. Narely Nobles PA-C [facility] [address]  [place], [place] [address] [phone]   01/02/2018, 11:17 AM

## 2018-01-03 ENCOUNTER — Telehealth: Payer: Self-pay | Admitting: Physician Assistant

## 2018-01-03 DIAGNOSIS — R059 Cough, unspecified: Secondary | ICD-10-CM

## 2018-01-03 DIAGNOSIS — R05 Cough: Secondary | ICD-10-CM

## 2018-01-03 MED ORDER — GUAIFENESIN-CODEINE 100-10 MG/5ML PO SOLN: 5.0000 mL | Freq: Three times a day (TID) | ORAL | 0 refills | Status: DC | PRN

## 2018-01-03 NOTE — Telephone Encounter (Signed)
What symptoms do you have? Coughing and her sides hurt. Wants something called in for her cough. OTC is not working.  How long have you been sick? Two days  Have you been seen for this problem? Seen Lawanna KobusAngel 01-03-18  If your provider decides to give you a prescription, which pharmacy would you like for it to be sent to[facility] [place]   Patient informed that this information will be sent to the clinical staff for review and that they should receive a follow up call.

## 2018-01-03 NOTE — Telephone Encounter (Signed)
I sent in cough medicine with codeine, do not take and drive as will cause drowsiness, do not take at same time as xanax as both cause drowsiness. If cough is worsening or any SOB, fevers new symptoms she needs to be seen.

## 2018-01-03 NOTE — Telephone Encounter (Signed)
Patient aware and verbalizes understanding. 

## 2018-01-05 ENCOUNTER — Telehealth: Payer: Self-pay | Admitting: Physician Assistant

## 2018-01-05 NOTE — Telephone Encounter (Signed)
yes

## 2018-01-05 NOTE — Telephone Encounter (Signed)
Aware. Note ready. 

## 2018-03-13 ENCOUNTER — Emergency Department (HOSPITAL_COMMUNITY)
Admission: EM | Admit: 2018-03-13 | Discharge: 2018-03-13 | Disposition: A | Discharge status: Home / Self Care | Payer: Self-pay | Attending: Emergency Medicine | Admitting: Emergency Medicine

## 2018-03-13 ENCOUNTER — Encounter (HOSPITAL_COMMUNITY): Payer: Self-pay

## 2018-03-13 ENCOUNTER — Other Ambulatory Visit: Payer: Self-pay

## 2018-03-13 DIAGNOSIS — Y939 Activity, unspecified: Secondary | ICD-10-CM | POA: Insufficient documentation

## 2018-03-13 DIAGNOSIS — Y999 Unspecified external cause status: Secondary | ICD-10-CM | POA: Insufficient documentation

## 2018-03-13 DIAGNOSIS — Y929 Unspecified place or not applicable: Secondary | ICD-10-CM | POA: Insufficient documentation

## 2018-03-13 DIAGNOSIS — F17210 Nicotine dependence, cigarettes, uncomplicated: Secondary | ICD-10-CM | POA: Insufficient documentation

## 2018-03-13 DIAGNOSIS — Z79899 Other long term (current) drug therapy: Secondary | ICD-10-CM | POA: Insufficient documentation

## 2018-03-13 DIAGNOSIS — X500XXA Overexertion from strenuous movement or load, initial encounter: Secondary | ICD-10-CM | POA: Insufficient documentation

## 2018-03-13 DIAGNOSIS — S39012A Strain of muscle, fascia and tendon of lower back, initial encounter: Secondary | ICD-10-CM | POA: Insufficient documentation

## 2018-03-13 DIAGNOSIS — N390 Urinary tract infection, site not specified: Secondary | ICD-10-CM | POA: Insufficient documentation

## 2018-03-13 LAB — URINALYSIS, ROUTINE W REFLEX MICROSCOPIC
Bilirubin Urine: NEGATIVE
Glucose, UA: NEGATIVE mg/dL
Ketones, ur: NEGATIVE mg/dL
NITRITE: POSITIVE — AB
Protein, ur: NEGATIVE mg/dL
SPECIFIC GRAVITY, URINE: 1.025 (ref 1.005–1.030)
pH: 5 (ref 5.0–8.0)

## 2018-03-13 MED ORDER — CYCLOBENZAPRINE HCL 10 MG PO TABS: 10.0000 mg | ORAL_TABLET | Freq: Three times a day (TID) | ORAL | 0 refills | Status: DC

## 2018-03-13 MED ORDER — IBUPROFEN 600 MG PO TABS: 600.0000 mg | ORAL_TABLET | Freq: Four times a day (QID) | ORAL | 0 refills | Status: AC

## 2018-03-13 MED ORDER — CEPHALEXIN 500 MG PO CAPS: 500.0000 mg | ORAL_CAPSULE | Freq: Four times a day (QID) | ORAL | 0 refills | Status: DC

## 2018-03-13 MED ORDER — DEXAMETHASONE SODIUM PHOSPHATE 10 MG/ML IJ SOLN
10.0000 mg | Freq: Once | INTRAMUSCULAR | Status: AC
Start: 2018-03-13 — End: 2018-03-13
  Administered 2018-03-13: 10 mg via INTRAMUSCULAR
  Filled 2018-03-13: qty 1

## 2018-03-13 MED ORDER — IBUPROFEN 800 MG PO TABS
800.0000 mg | ORAL_TABLET | Freq: Once | ORAL | Status: AC
  Administered 2018-03-13: 800 mg via ORAL
  Filled 2018-03-13: qty 1

## 2018-03-13 MED ORDER — DEXAMETHASONE 4 MG PO TABS: 4.0000 mg | ORAL_TABLET | Freq: Two times a day (BID) | ORAL | 0 refills | Status: DC

## 2018-03-13 MED ORDER — CEPHALEXIN 500 MG PO CAPS
500.0000 mg | ORAL_CAPSULE | Freq: Once | ORAL | Status: AC
  Administered 2018-03-13: 500 mg via ORAL
  Filled 2018-03-13: qty 1

## 2018-03-13 NOTE — ED Provider Notes (Signed)
[facility] EMERGENCY DEPARTMENT Provider Note   CSN: [id number] Arrival date & time: 03/13/18  1634     History   Chief Complaint Chief Complaint  Patient presents with  . Back Pain    HPI Krista Thomas is a 52 y.o. female.  The history is provided by the patient.  Back Pain   This is a new problem. The current episode started more than 1 week ago. The problem occurs daily. The problem has been gradually worsening. The pain is associated with lifting heavy objects. The pain is present in the lumbar spine. The quality of the pain is described as aching (sharpe shooting pain). The pain does not radiate. The pain is moderate. The symptoms are aggravated by bending, twisting and certain positions. The pain is the same all the time. Pertinent negatives include no chest pain, no numbness, no abdominal pain, no bowel incontinence, no perianal numbness, no bladder incontinence and no dysuria.    Past Medical History:  Diagnosis Date  . Depression     Patient Active Problem List   Diagnosis Date Noted  . Generalized anxiety disorder 12/03/2016  . Depression 07/26/2016  . Episodic mood disorder (HCC) 07/26/2016  . Grief reaction 07/26/2016  . Anemia 12/16/2012  . Dental caries into pulp, #31 12/15/2012  . Dental abscess 12/13/2012  . Dehydration 12/13/2012  . Intractable pain 12/13/2012    Past Surgical History:  Procedure Laterality Date  . DEBRIDEMENT MANDIBLE  12/15/2012   Procedure: DEBRIDEMENT MANDIBLE;  Surgeon: Francene Finders, DDS;  Location: [facility] OR;  Service: Oral Surgery;  Laterality: Right;  Dental Extractions with I&D of right sided submandibular space infection.  . TOOTH EXTRACTION  12/15/2012   Procedure: DENTAL RESTORATION/EXTRACTIONS;  Surgeon: Francene Finders, DDS;  Location: [facility] OR;  Service: Oral Surgery;  Laterality: Right;     OB History   None      Home Medications    Prior to Admission medications   Medication Sig Start Date End Date  Taking? Authorizing Provider  alprazolam Prudy Feeler) 2 MG tablet Take 1 tablet (2 mg total) by mouth 3 (three) times daily. 01/02/18   Remus Loffler, PA-C  azithromycin (ZITHROMAX) 250 MG tablet Take as directed 01/02/18   Remus Loffler, PA-C  citalopram (CELEXA) 20 MG tablet Take 1 tablet (20 mg total) by mouth daily. 01/02/18   Remus Loffler, PA-C  fluticasone (FLONASE) 50 MCG/ACT nasal spray Place 1 spray into both nostrils 2 (two) times daily as needed for allergies or rhinitis. 10/18/16   Dettinger, Elige Radon, MD  guaiFENesin-codeine 100-10 MG/5ML syrup Take 5 mLs by mouth 3 (three) times daily as needed for cough. 01/03/18   Johna Sheriff, MD  risperiDONE (RISPERDAL) 1 MG tablet Take 1-2 tablets (1-2 mg total) by mouth at bedtime. 07/01/17   Remus Loffler, PA-C  traZODone (DESYREL) 100 MG tablet Take 1 tablet (100 mg total) by mouth at bedtime. 05/05/17   Remus Loffler, PA-C    Family History Family History  Problem Relation Age of Onset  . Atrial fibrillation Mother   . Hypotension Father     Social History Social History   Tobacco Use  . Smoking status: Current Every Day Smoker    Packs/day: 0.50    Types: Cigarettes  . Smokeless tobacco: Never Used  Substance Use Topics  . Alcohol use: No  . Drug use: No     Allergies   Patient has no known allergies.   Review of  Systems Review of Systems  Constitutional: Negative for activity change.       All ROS Neg except as noted in HPI  HENT: Negative for nosebleeds.   Eyes: Negative for photophobia and discharge.  Respiratory: Negative for cough, shortness of breath and wheezing.   Cardiovascular: Negative for chest pain and palpitations.  Gastrointestinal: Negative for abdominal pain, blood in stool and bowel incontinence.  Genitourinary: Negative for bladder incontinence, dysuria, frequency and hematuria.  Musculoskeletal: Positive for back pain. Negative for arthralgias and neck pain.  Skin: Negative.   Neurological: Negative  for dizziness, seizures, speech difficulty and numbness.  Psychiatric/Behavioral: Negative for confusion and hallucinations.     Physical Exam Updated Vital Signs BP 118/83 (BP Location: Right Arm)   Pulse 94   Temp 98.2 F (36.8 C) (Oral)   Resp 16   Wt 81.6 kg (180 lb)   LMP 02/04/2018   SpO2 98%   BMI 32.92 kg/m   Physical Exam  Constitutional: She is oriented to person, place, and time. She appears well-developed and well-nourished.  Non-toxic appearance.  HENT:  Head: Normocephalic.  Right Ear: Tympanic membrane and external ear normal.  Left Ear: Tympanic membrane and external ear normal.  Eyes: Pupils are equal, round, and reactive to light. EOM and lids are normal.  Neck: Normal range of motion. Neck supple. Carotid bruit is not present.  Cardiovascular: Normal rate, regular rhythm, normal heart sounds, intact distal pulses and normal pulses.  Pulmonary/Chest: Breath sounds normal. No respiratory distress.  Abdominal: Soft. Bowel sounds are normal. There is no tenderness. There is no guarding.  Musculoskeletal: Normal range of motion.       Lumbar back: She exhibits pain and spasm.       Back:  Lymphadenopathy:       Head (right side): No submandibular adenopathy present.       Head (left side): No submandibular adenopathy present.    She has no cervical adenopathy.  Neurological: She is alert and oriented to person, place, and time. She has normal strength. No cranial nerve deficit or sensory deficit.  Skin: Skin is warm and dry.  Psychiatric: She has a normal mood and affect. Her speech is normal.  Nursing note and vitals reviewed.    ED Treatments / Results  Labs (all labs ordered are listed, but only abnormal results are displayed) Labs Reviewed  URINALYSIS, ROUTINE W REFLEX MICROSCOPIC    EKG None  Radiology No results found.  Procedures Procedures (including critical care time)  Medications Ordered in ED Medications - No data to  display   Initial Impression / Assessment and Plan / ED Course  I have reviewed the triage vital signs and the nursing notes.  Pertinent labs & imaging results that were available during my care of the patient were reviewed by me and considered in my medical decision making (see chart for details).       Final Clinical Impressions(s) / ED Diagnoses MDM  Vital signs reviewed. No gross neurologic deficits appreciated.  No evidence for cauda equina.  Urine analysis is consistent with a urinary tract infection.  I suspect the patient has muscle strain involving the lumbar area.  The patient will be treated with Flexeril, ibuprofen, Decadron, and Keflex for the urinary tract infection.  Patient is to follow-up with Dr. Yetta Barre for continued management of these issues.  Patient is in agreement with this plan.   Final diagnoses:  Strain of lumbar region, initial encounter  Urinary tract infection without  hematuria, site unspecified    ED Discharge Orders    None       Ivery QualeBryant, Essence Merle, PA-C 03/13/18 1900    Pricilla LovelessGoldston, Scott, MD 03/14/18 [phone]

## 2018-03-13 NOTE — ED Triage Notes (Signed)
Pt reports that she has lower back pain for 10 days. Pain has been constant. Pt has taken tylenol and motrin. 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

## 2018-03-13 NOTE — Discharge Instructions (Addendum)
Your examination suggest muscle strain involving your lower back.  Please use a heating pad to the area.  Please use Flexeril 3 times daily.  Use ibuprofen with breakfast, lunch, dinner, and at bedtime.  Use Decadron 2 times daily with food.  Flexeril may cause drowsiness.  Please do not drive a vehicle, operate machinery, drink alcohol, or participate in activities requiring concentration when taking this medication.  Your testing also shows a urinary tract infection.  Please use Keflex with breakfast, lunch, dinner, and at bedtime until all taken.  Please increase fluids.  Please follow-up with Dr. Yetta BarreJones concerning these issues, and to continue management in the office.

## 2018-03-16 LAB — URINE CULTURE
Culture: >=100000 — AB
SPECIAL REQUESTS: NORMAL

## 2018-03-17 ENCOUNTER — Telehealth: Payer: Self-pay | Admitting: Emergency Medicine

## 2018-03-17 NOTE — Telephone Encounter (Signed)
Post ED Visit - Positive Culture Follow-up  Culture report reviewed by antimicrobial stewardship pharmacist:  [x]  Enzo BiNathan Batchelder, Pharm.D. []  Celedonio MiyamotoJeremy Frens, Pharm.D., BCPS AQ-ID []  Garvin FilaMike Maccia, Pharm.D., BCPS []  Georgina PillionElizabeth Martin, Pharm.D., BCPS []  GoodfieldMinh Pham, VermontPharm.D., BCPS, AAHIVP []  Estella HuskMichelle Turner, Pharm.D., BCPS, AAHIVP []  Lysle Pearlachel Rumbarger, PharmD, BCPS []  Blake DivineShannon Parkey, PharmD []  Pollyann SamplesAndy Johnston, PharmD, BCPS  Positive urine culture Treated with cephalexin, organism sensitive to the same and no further patient follow-up is required at this time.  Berle MullMiller, Deija Buhrman 03/17/2018, 10:39 AM

## 2018-04-27 ENCOUNTER — Telehealth: Payer: Self-pay | Admitting: Physician Assistant

## 2018-04-27 NOTE — Telephone Encounter (Signed)
Patient aware, script is ready. 

## 2018-04-27 NOTE — Telephone Encounter (Signed)
Yes, okay to fill now

## 2018-05-19 ENCOUNTER — Other Ambulatory Visit: Payer: Self-pay | Admitting: Physician Assistant

## 2018-05-19 ENCOUNTER — Encounter: Payer: Self-pay | Admitting: *Deleted

## 2018-05-19 ENCOUNTER — Telehealth: Payer: Self-pay | Admitting: Physician Assistant

## 2018-05-19 MED ORDER — CITALOPRAM HYDROBROMIDE 40 MG PO TABS: 40.0000 mg | ORAL_TABLET | Freq: Every day | ORAL | 5 refills | Status: DC

## 2018-05-19 NOTE — Telephone Encounter (Signed)
Patient aware.

## 2018-05-19 NOTE — Telephone Encounter (Signed)
sent 

## 2018-06-29 ENCOUNTER — Other Ambulatory Visit: Payer: Self-pay | Admitting: Physician Assistant

## 2018-06-29 ENCOUNTER — Telehealth: Payer: Self-pay | Admitting: Physician Assistant

## 2018-06-29 DIAGNOSIS — F411 Generalized anxiety disorder: Secondary | ICD-10-CM

## 2018-06-29 DIAGNOSIS — F39 Unspecified mood [affective] disorder: Secondary | ICD-10-CM

## 2018-06-29 MED ORDER — ALPRAZOLAM 2 MG PO TABS: 2.0000 mg | ORAL_TABLET | Freq: Three times a day (TID) | ORAL | 1 refills | Status: DC

## 2018-06-29 NOTE — Telephone Encounter (Signed)
sent 

## 2018-06-29 NOTE — Telephone Encounter (Signed)
Patient aware.

## 2018-07-01 ENCOUNTER — Encounter: Payer: Self-pay | Admitting: Physician Assistant

## 2018-07-01 ENCOUNTER — Ambulatory Visit (INDEPENDENT_AMBULATORY_CARE_PROVIDER_SITE_OTHER): Payer: Self-pay | Admitting: Physician Assistant

## 2018-07-01 DIAGNOSIS — F39 Unspecified mood [affective] disorder: Secondary | ICD-10-CM

## 2018-07-01 DIAGNOSIS — F411 Generalized anxiety disorder: Secondary | ICD-10-CM

## 2018-07-01 MED ORDER — ALPRAZOLAM 2 MG PO TABS: 2.0000 mg | ORAL_TABLET | Freq: Three times a day (TID) | ORAL | 5 refills | Status: DC

## 2018-07-01 MED ORDER — CITALOPRAM HYDROBROMIDE 40 MG PO TABS: 40.0000 mg | ORAL_TABLET | Freq: Every day | ORAL | 5 refills | Status: DC

## 2018-07-01 NOTE — Patient Instructions (Signed)
Your provider wants you to schedule an appointment with a Psychologist/Psychiatrist. The following list of offices requires the patient to call and make their own appointment, as there is information they need that only you can provide. Please feel free to choose form the following providers:  [facility]   [phone] Crisis Recovery in [place] [phone]  [facility]  [phone]   [address] [place], [place]  (Scheduled through [facility]) Must call and do an interview for appointment. Sees Children / Accepts Medicaid  [facility]    [phone]  [address], [address]    [place], [place]       [facility]  [phone] [address] [place], [place]  Evaluates for Autism but does not treat it Sees Children / Accepts Medicaid  [facility]    [phone] [address], [address]   [place], [place] Medication management, substance abuse, bipolar, grief, family, marriage, OCD, anxiety, PTSD Sees children / Accepts Medicaid  [facility]    [phone] [address] [place], [place] Sees children / Accepts Medicaid  [facility]  [phone] [address] [place], [place]   Dr Akinlayo     [phone] [address] [place], [place]  Sees ADD & ADHD for treatment Accepts Medicaid  [facility]  [phone] [address] [place], [place] Evaluates for Autism Accepts Medicaid  [facility]  [phone] [address] [place], [place]  Does Adult ADD evaluations Does not accept Medicaid  [facility]   [phone] [address]   [place],  Uses animal therapy  Sees children as young as 3 years old Accepts Medicaid  [facility]     [phone]    [address]  [place],  [address] Sees children Accepts Medicaid  

## 2018-07-02 NOTE — Progress Notes (Signed)
BP 118/85   Pulse 96   Temp 98.8 F (37.1 C) (Oral)   Ht 5\' 2"  (1.575 m)   Wt 188 lb 9.6 oz (85.5 kg)   BMI 34.50 kg/m    Subjective:    Patient ID: Krista Thomas, female    DOB: 04/05/1966, 52 y.o.   MRN: [id number]  HPI: Krista Thomas is a 52 y.o. female presenting on 07/01/2018 for Depression (6 month )  This patient comes in for periodic recheck on medications and conditions including depression and anxiety.  She is having some grief still going on for the son that was murdered.  Been over 2 years now.  In the upcoming month there will be a trial.  She is highly anxious and worried about this.  We have discussed that hopefully this will help her to move through any exposure warrants everything is handled.  I have given her the names of counselors in the county that she can call for reduced price counseling..   All medications are reviewed today. There are no reports of any problems with the medications. All of the medical conditions are reviewed and updated.  Lab work is reviewed and will be ordered as medically necessary. There are no new problems reported with today's visit.   Past Medical History:  Diagnosis Date  . Depression    Relevant past medical, surgical, family and social history reviewed and updated as indicated. Interim medical history since our last visit reviewed. Allergies and medications reviewed and updated. DATA REVIEWED: CHART IN EPIC  Family History reviewed for pertinent findings.  Review of Systems  Constitutional: Negative.   HENT: Negative.   Eyes: Negative.   Respiratory: Negative.   Gastrointestinal: Negative.   Genitourinary: Negative.   Psychiatric/Behavioral: Positive for decreased concentration, dysphoric mood and sleep disturbance. Negative for suicidal ideas. The patient is nervous/anxious.     Allergies as of 07/01/2018   No Known Allergies     Medication List        Accurate as of 07/01/18 11:59 PM. Always use your most recent med  list.          alprazolam 2 MG tablet Commonly known as:  XANAX Take 1 tablet (2 mg total) by mouth 3 (three) times daily.   citalopram 40 MG tablet Commonly known as:  CELEXA Take 1 tablet (40 mg total) by mouth daily.   fluticasone 50 MCG/ACT nasal spray Commonly known as:  FLONASE Place 1 spray into both nostrils 2 (two) times daily as needed for allergies or rhinitis.   guaiFENesin-codeine 100-10 MG/5ML syrup Take 5 mLs by mouth 3 (three) times daily as needed for cough.   ibuprofen 600 MG tablet Commonly known as:  ADVIL,MOTRIN Take 1 tablet (600 mg total) by mouth 4 (four) times daily.          Objective:    BP 118/85   Pulse 96   Temp 98.8 F (37.1 C) (Oral)   Ht 5\' 2"  (1.575 m)   Wt 188 lb 9.6 oz (85.5 kg)   BMI 34.50 kg/m   No Known Allergies  Wt Readings from Last 3 Encounters:  07/01/18 188 lb 9.6 oz (85.5 kg)  03/13/18 180 lb (81.6 kg)  01/02/18 208 lb (94.3 kg)    Physical Exam  Constitutional: She is oriented to person, place, and time. She appears well-developed and well-nourished.  HENT:  Head: Normocephalic and atraumatic.  Eyes: Pupils are equal, round, and reactive to light. Conjunctivae and EOM are normal.  Cardiovascular: Normal rate, regular rhythm, normal heart sounds and intact distal pulses.  Pulmonary/Chest: Effort normal and breath sounds normal.  Abdominal: Soft. Bowel sounds are normal.  Neurological: She is alert and oriented to person, place, and time. She has normal reflexes.  Skin: Skin is warm and dry. No rash noted.  Psychiatric: She has a normal mood and affect. Her behavior is normal. Judgment and thought content normal.        Assessment & Plan:   1. Episodic mood disorder (HCC) - alprazolam (XANAX) 2 MG tablet; Take 1 tablet (2 mg total) by mouth 3 (three) times daily.  Dispense: 90 tablet; Refill: 5 - citalopram (CELEXA) 40 MG tablet; Take 1 tablet (40 mg total) by mouth daily.  Dispense: 30 tablet; Refill:  5  2. Generalized anxiety disorder - alprazolam (XANAX) 2 MG tablet; Take 1 tablet (2 mg total) by mouth 3 (three) times daily.  Dispense: 90 tablet; Refill: 5 - citalopram (CELEXA) 40 MG tablet; Take 1 tablet (40 mg total) by mouth daily.  Dispense: 30 tablet; Refill: 5   Continue all other maintenance medications as listed above.  Follow up plan: Return in about 6 months (around 01/01/2019) for recheck.  Educational handout given for survey  Remus LofflerAngel S. Markos Theil PA-C [facility] [address]  [place], [place] [id number] [phone]   07/02/2018, 10:26 PM

## 2018-07-07 ENCOUNTER — Ambulatory Visit: Payer: Self-pay | Admitting: Physician Assistant

## 2018-07-14 ENCOUNTER — Ambulatory Visit: Payer: Self-pay | Admitting: Physician Assistant

## 2018-08-10 ENCOUNTER — Ambulatory Visit (INDEPENDENT_AMBULATORY_CARE_PROVIDER_SITE_OTHER): Payer: Self-pay | Admitting: Family Medicine

## 2018-08-10 ENCOUNTER — Encounter: Payer: Self-pay | Admitting: Family Medicine

## 2018-08-10 VITALS — BP 116/82 | HR 96 | Temp 98.5°F | Ht 62.0 in | Wt 179.0 lb

## 2018-08-10 DIAGNOSIS — R05 Cough: Secondary | ICD-10-CM

## 2018-08-10 DIAGNOSIS — Z720 Tobacco use: Secondary | ICD-10-CM | POA: Insufficient documentation

## 2018-08-10 DIAGNOSIS — R059 Cough, unspecified: Secondary | ICD-10-CM

## 2018-08-10 DIAGNOSIS — J069 Acute upper respiratory infection, unspecified: Secondary | ICD-10-CM

## 2018-08-10 MED ORDER — AZITHROMYCIN 250 MG PO TABS: ORAL_TABLET | ORAL | 0 refills | Status: DC

## 2018-08-10 MED ORDER — GUAIFENESIN-CODEINE 100-10 MG/5ML PO SOLN: 10.0000 mL | Freq: Three times a day (TID) | ORAL | 0 refills | Status: AC | PRN

## 2018-08-10 NOTE — Patient Instructions (Signed)
Acute Bronchitis, Adult Acute bronchitis is when air tubes (bronchi) in the lungs suddenly get swollen. The condition can make it hard to breathe. It can also cause these symptoms:  A cough.  Coughing up clear, yellow, or green mucus.  Wheezing.  Chest congestion.  Shortness of breath.  A fever.  Body aches.  Chills.  A sore throat.  Follow these instructions at home: Medicines  Take over-the-counter and prescription medicines only as told by your doctor.  If you were prescribed an antibiotic medicine, take it as told by your doctor. Do not stop taking the antibiotic even if you start to feel better. General instructions  Rest.  Drink enough fluids to keep your pee (urine) clear or pale yellow.  Avoid smoking and secondhand smoke. If you smoke and you need help quitting, ask your doctor. Quitting will help your lungs heal faster.  Use an inhaler, cool mist vaporizer, or humidifier as told by your doctor.  Keep all follow-up visits as told by your doctor. This is important. How is this prevented? To lower your risk of getting this condition again:  Wash your hands often with soap and water. If you cannot use soap and water, use hand sanitizer.  Avoid contact with people who have cold symptoms.  Try not to touch your hands to your mouth, nose, or eyes.  Make sure to get the flu shot every year.  Contact a doctor if:  Your symptoms do not get better in 2 weeks. Get help right away if:  You cough up blood.  You have chest pain.  You have very bad shortness of breath.  You become dehydrated.  You faint (pass out) or keep feeling like you are going to pass out.  You keep throwing up (vomiting).  You have a very bad headache.  Your fever or chills gets worse. This information is not intended to replace advice given to you by your health care provider. Make sure you discuss any questions you have with your health care provider. Document Released:  04/29/2008 Document Revised: 06/19/2016 Document Reviewed: 05/01/2016 Elsevier Interactive Patient Education  2018 [employer]. Tobacco Use Disorder Tobacco use disorder (TUD) is a mental disorder. It is the long-term use of tobacco in spite of related health problems or difficulty with normal life activities. Tobacco is most commonly smoked as cigarettes and less commonly as cigars or pipes. Smokeless chewing tobacco and snuff are also popular. People with TUD get a feeling of extreme pleasure (euphoria) from using tobacco and have a desire to use it again and again. Repeated use of tobacco can cause problems. The addictive effects of tobacco are due mainly tothe ingredient nicotine. Nicotine also causes a rush of adrenaline (epinephrine) in the body. This leads to increased blood pressure, heart rate, and breathing rate. These changes may cause problems for people with high blood pressure, weak hearts, or lung disease. High doses of nicotine in children and pets can lead to seizures and death. Tobacco contains a number of other unsafe chemicals. These chemicals are especially harmful when inhaled as smoke and can damage almost every organ in the body. Smokers live shorter lives than nonsmokers and are at risk of dying from a number of diseases and cancers. Tobacco smoke can also cause health problems for nonsmokers (due to inhaling secondhand smoke). Smoking is also a fire hazard. TUD usually starts in the late teenage years and is most common in young adults between the ages of 30 and 25 years. People who  start smoking earlier in life are more likely to continue smoking as adults. TUD is somewhat more common in men than women. People with TUD are at higher risk for using alcohol and other drugs of abuse. What increases the risk? Risk factors for TUD include:  Having family members with the disorder.  Being around people who use tobacco.  Having an existing mental health issue such as  schizophrenia, depression, bipolar disorder, ADHD, or posttraumatic stress disorder (PTSD).  What are the signs or symptoms? People with tobacco use disorder have two or more of the following signs and symptoms within 12 months:  Use of more tobacco over a longer period than intended.  Not able to cut down or control tobacco use.  A lot of time spent obtaining or using tobacco.  Strong desire or urge to use tobacco (craving). Cravings may last for 6 months or longer after quitting.  Use of tobacco even when use leads to major problems at work, school, or home.  Use of tobacco even when use leads to relationship problems.  Giving up or cutting down on important life activities because of tobacco use.  Repeatedly using tobacco in situations where it puts you or others in physical danger, like smoking in bed.  Use of tobacco even when it is known that a physical or mental problem is likely related to tobacco use. ? Physical problems are numerous and may include chronic bronchitis, emphysema, lung and other cancers, gum disease, high blood pressure, heart disease, and stroke. ? Mental problems caused by tobacco may include difficulty sleeping and anxiety.  Need to use greater amounts of tobacco to get the same effect. This means you have developed a tolerance.  Withdrawal symptoms as a result of stopping or rapidly cutting back use. These symptoms may last a month or more after quitting and include the following: ? Depressed, anxious, or irritable mood. ? Difficulty concentrating. ? Increased appetite. ? Restlessness or trouble sleeping. ? Use of tobacco to avoid withdrawal symptoms.  How is this diagnosed? Tobacco use disorder is diagnosed by your health care provider. A diagnosis may be made by:  Your health care provider asking questions about your tobacco use and any problems it may be causing.  A physical exam.  Lab tests.  You may be referred to a mental health  professional or addiction specialist.  The severity of tobacco use disorder depends on the number of signs and symptoms you have:  Mild-Two or three symptoms.  Moderate-Four or five symptoms.  Severe-Six or more symptoms.  How is this treated? Many people with tobacco use disorder are unable to quit on their own and need help. Treatment options include the following:  Nicotine replacement therapy (NRT). NRT provides nicotine without the other harmful chemicals in tobacco. NRT gradually lowers the dosage of nicotine in the body and reduces withdrawal symptoms. NRT is available in over-the-counter forms (gum, lozenges, and skin patches) as well as prescription forms (mouth inhaler and nasal spray).  Medicines.This may include: ? Antidepressant medicine that may reduce nicotine cravings. ? A medicine that acts on nicotine receptors in the brain to reduce cravings and withdrawal symptoms. It may also block the effects of tobacco in people with TUD who relapse.  Counseling or talk therapy. A form of talk therapy called behavioral therapy is commonly used to treat people with TUD. Behavioral therapy looks at triggers for tobacco use, how to avoid them, and how to cope with cravings. It is most effective in person  or by phone but is also available in self-help forms (books and Internet websites).  Support groups. These provide emotional support, advice, and guidance for quitting tobacco.  The most effective treatment for TUD is usually a combination of medicine, talk therapy, and support groups. Follow these instructions at home:  Keep all follow-up visits as directed by your health care provider. This is important.  Take medicines only as directed by your health care provider.  Check with your health care provider before starting new prescription or over-the-counter medicines. Contact a health care provider if:  You are not able to take your medicines as prescribed.  Treatment is not  helping your TUD and your symptoms get worse. Get help right away if:  You have serious thoughts about hurting yourself or others.  You have trouble breathing, chest pain, sudden weakness, or sudden numbness in part of your body. This information is not intended to replace advice given to you by your health care provider. Make sure you discuss any questions you have with your health care provider. Document Released: 07/17/2004 Document Revised: 07/14/2016 Document Reviewed: 01/07/2014 Elsevier Interactive Patient Education  [employer].

## 2018-08-10 NOTE — Progress Notes (Addendum)
Subjective:    Patient ID: Krista Thomas, female    DOB: [birth year], 52 y.o.   MRN: [id number]  Chief Complaint:  Diarrhea (x 2 days) and Cough, chest congestion   HPI: Krista Thomas is a 52 y.o. female presenting on 08/10/2018 for Diarrhea (x 2 days) and Cough, chest congestion   1. Upper respiratory tract infection, unspecified type  Pt presents today with fever, chills, chest congestion, cough, and fatigue. Pt states this started on Saturday and has progressively became worse. Pt states she has has a sore throat and chest soreness. States her throat feels scratchy, 5/10. Pt states her chest is sore, bilateral lower ribs, 8/10, worse with coughing and deep breathing. She has taken Tylenol and Robitussin DM without relief of symptoms. Pt states she has also had diarrhea for 2 days, states seems to be getting better.   2. Tobacco abuse   3. Cough  Pt reports the cough started on Saturday and continues to get worse. States the cough is non-productive and worse at night or with activity. Denies shortness of breath. States OTC dough syrups are not helping.      Relevant past medical, surgical, family and social history reviewed and updated as indicated. Interim medical history since our last visit reviewed. Allergies and medications reviewed and updated. DATA REVIEWED: CHART IN EPIC  Family History reviewed for pertinent findings.  Past Medical History:  Diagnosis Date  . Depression     Past Surgical History:  Procedure Laterality Date  . DEBRIDEMENT MANDIBLE  12/15/2012   Procedure: DEBRIDEMENT MANDIBLE;  Surgeon: Francene Finders, DDS;  Location: [facility] OR;  Service: Oral Surgery;  Laterality: Right;  Dental Extractions with I&D of right sided submandibular space infection.  . TOOTH EXTRACTION  12/15/2012   Procedure: DENTAL RESTORATION/EXTRACTIONS;  Surgeon: Francene Finders, DDS;  Location: [facility] OR;  Service: Oral Surgery;  Laterality: Right;    Social History    Socioeconomic History  . Marital status: Widowed    Spouse name: Not on file  . Number of children: Not on file  . Years of education: Not on file  . Highest education level: Not on file  Occupational History  . Not on file  Social Needs  . Financial resource strain: Not on file  . Food insecurity:    Worry: Not on file    Inability: Not on file  . Transportation needs:    Medical: Not on file    Non-medical: Not on file  Tobacco Use  . Smoking status: Current Every Day Smoker    Packs/day: 0.50    Types: Cigarettes  . Smokeless tobacco: Never Used  Substance and Sexual Activity  . Alcohol use: No  . Drug use: No  . Sexual activity: Yes  Lifestyle  . Physical activity:    Days per week: Not on file    Minutes per session: Not on file  . Stress: Not on file  Relationships  . Social connections:    Talks on phone: Not on file    Gets together: Not on file    Attends religious service: Not on file    Active member of club or organization: Not on file    Attends meetings of clubs or organizations: Not on file    Relationship status: Not on file  . Intimate partner violence:    Fear of current or ex partner: Not on file    Emotionally abused: Not on file    Physically abused: Not  on file    Forced sexual activity: Not on file  Other Topics Concern  . Not on file  Social History Narrative  . Not on file    Outpatient Encounter Medications as of 08/10/2018  Medication Sig  . alprazolam (XANAX) 2 MG tablet Take 1 tablet (2 mg total) by mouth 3 (three) times daily.  . citalopram (CELEXA) 40 MG tablet Take 1 tablet (40 mg total) by mouth daily.  . fluticasone (FLONASE) 50 MCG/ACT nasal spray Place 1 spray into both nostrils 2 (two) times daily as needed for allergies or rhinitis.  Marland Kitchen ibuprofen (ADVIL,MOTRIN) 600 MG tablet Take 1 tablet (600 mg total) by mouth 4 (four) times daily.  Marland Kitchen azithromycin (ZITHROMAX Z-PAK) 250 MG tablet As directed  . guaiFENesin-codeine 100-10  MG/5ML syrup Take 10 mLs by mouth 3 (three) times daily as needed for up to 5 days for cough.  . [DISCONTINUED] guaiFENesin-codeine 100-10 MG/5ML syrup Take 5 mLs by mouth 3 (three) times daily as needed for cough.   No facility-administered encounter medications on file as of 08/10/2018.     No Known Allergies  Review of Systems  Constitutional: Positive for appetite change, chills, fatigue and fever.  HENT: Positive for congestion and sore throat. Negative for postnasal drip, rhinorrhea, sinus pressure, sinus pain and sneezing.   Respiratory: Positive for cough. Negative for chest tightness, shortness of breath and wheezing.        Chest/Rib soreness   Cardiovascular: Positive for chest pain (Chest and rib soreness ). Negative for palpitations.  Musculoskeletal: Negative for myalgias.  Skin: Negative.   Neurological: Positive for weakness (generalized). Negative for dizziness, light-headedness and headaches.  All other systems reviewed and are negative.       Objective:    BP 116/82   Pulse 96   Temp 98.5 F (36.9 C) (Oral)   Ht 5\' 2"  (1.575 m)   Wt 179 lb (81.2 kg)   SpO2 98%   BMI 32.74 kg/m    Wt Readings from Last 3 Encounters:  08/10/18 179 lb (81.2 kg)  07/01/18 188 lb 9.6 oz (85.5 kg)  03/13/18 180 lb (81.6 kg)    Physical Exam  Constitutional: She is oriented to person, place, and time. She appears well-developed and well-nourished.  HENT:  Head: Normocephalic.  Right Ear: Hearing, tympanic membrane, external ear and ear canal normal.  Left Ear: Hearing, tympanic membrane, external ear and ear canal normal.  Nose: Nose normal.  Mouth/Throat: Uvula is midline and mucous membranes are normal. Posterior oropharyngeal erythema present. No oropharyngeal exudate, posterior oropharyngeal edema or tonsillar abscesses. No tonsillar exudate.  Neck: Normal range of motion. Neck supple. No JVD present. No tracheal deviation present. No thyromegaly present.    Cardiovascular: Normal rate, regular rhythm and normal heart sounds.  Pulmonary/Chest: Effort normal. She has rhonchi in the right lower field and the left lower field. She exhibits tenderness (bilateral posterior lower ribs).  Lymphadenopathy:    She has no cervical adenopathy.  Neurological: She is alert and oriented to person, place, and time.  Skin: Skin is warm and dry. Capillary refill takes less than 2 seconds.  Psychiatric: She has a normal mood and affect. Her behavior is normal. Judgment and thought content normal.  Nursing note and vitals reviewed.       Assessment & Plan:   Soren Pigman comes in today with chief complaint of Diarrhea (x 2 days) and Cough, chest congestion   1. Upper respiratory tract infection, unspecified type Increase  fluids, add humidity to air, medications as prescribed.   azithromycin (ZITHROMAX Z-PAK) 250 MG tablet          As directed, Normal     2. Tobacco abuse Smoking cessation education  3. Cough guaiFENesin-codeine 100-10 MG/5ML syrup           Take 10 mLs by mouth 3 (three) times daily as needed for up to 5 days for cough., Starting Mon 08/10/2018, Until Sat 08/15/2018, Print     Continue all other maintenance medications.  Follow up plan: Return if symptoms worsen or fail to improve.  The above assessment and management plan was discussed with the patient. The patient verbalized understanding of and has agreed to the management plan. Patient is aware to call the clinic if symptoms persist or worsen. Patient is aware when to return to the clinic for a follow-up visit. Patient educated on when it is appropriate to go to the emergency department.   Kari BaarsMichelle Rakes, FNP-C [facility] [phone]

## 2019-01-04 ENCOUNTER — Ambulatory Visit: Payer: Self-pay | Admitting: Physician Assistant

## 2019-01-04 ENCOUNTER — Other Ambulatory Visit: Payer: Self-pay | Admitting: Physician Assistant

## 2019-01-04 ENCOUNTER — Encounter: Payer: Self-pay | Admitting: Physician Assistant

## 2019-01-04 DIAGNOSIS — F411 Generalized anxiety disorder: Secondary | ICD-10-CM

## 2019-01-04 DIAGNOSIS — F39 Unspecified mood [affective] disorder: Secondary | ICD-10-CM

## 2019-01-04 MED ORDER — CITALOPRAM HYDROBROMIDE 40 MG PO TABS: 40.0000 mg | ORAL_TABLET | Freq: Every day | ORAL | 5 refills | Status: DC

## 2019-01-04 MED ORDER — ALPRAZOLAM 2 MG PO TABS: 1.0000 mg | ORAL_TABLET | Freq: Three times a day (TID) | ORAL | 5 refills | Status: DC | PRN

## 2019-01-04 NOTE — Progress Notes (Addendum)
BP 110/69   Pulse 92   Temp 98.2 F (36.8 C) (Oral)   Ht 5\' 2"  (1.575 m)   Wt 161 lb 12.8 oz (73.4 kg)   BMI 29.59 kg/m    Subjective:    Patient ID: Krista Thomas, female    DOB: 1966-08-07, 53 y.o.   MRN: [id number]  HPI: Krista Thomas is a 53 y.o. female presenting on 01/04/2019 for Anxiety and Depression  This patient comes in for 1211-month recheck on her anxiety and depression.  She states that she feels about the same.  No major changes in anything.  She has gone through the court proceedings for her son's murder.  The person who killed him did get 33 years.  However really bothered her to have to go through the court trial and hear everything again.  She is having a lot of anxiety getting bad dreams.  She wishes she could go further with some counseling but due to no insurance she is not able to do this.  She does have custody of her grandson.  She is doing this alone.  [facility] 01/04/2019 08/10/2018 07/01/2018 01/02/2018 07/01/2017  Decreased Interest 3 3 3 3 2   Down, Depressed, Hopeless 3 3 3 3 3   PHQ - 2 Score 6 6 6 6 5   Altered sleeping 3 3 3 3 3   Tired, decreased energy 3 3 3 3 3   Change in appetite 3 3 3 3 3   Feeling bad or failure about yourself  3 3 3 3 3   Trouble concentrating 3 3 3 3 2   Moving slowly or fidgety/restless 2 0 3 0 2  Suicidal thoughts 0 0 0 0 0  PHQ-9 Score 23 21 24 21 21   Difficult doing work/chores - - - - -     Past Medical History:  Diagnosis Date  . Depression    Relevant past medical, surgical, family and social history reviewed and updated as indicated. Interim medical history since our last visit reviewed. Allergies and medications reviewed and updated. DATA REVIEWED: CHART IN EPIC  Family History reviewed for pertinent findings.  Review of Systems  Constitutional: Negative.   HENT: Negative.   Eyes: Negative.   Respiratory: Negative.   Gastrointestinal: Negative.   Genitourinary: Negative.   Psychiatric/Behavioral:  Positive for dysphoric mood and sleep disturbance. The patient is nervous/anxious.     Allergies as of 01/04/2019   No Known Allergies     Medication List       Accurate as of January 04, 2019  2:55 PM. Always use your most recent med list.        alprazolam 2 MG tablet Commonly known as:  XANAX Take 0.5-1 tablets (1-2 mg total) by mouth 3 (three) times daily as needed for anxiety.   citalopram 40 MG tablet Commonly known as:  CELEXA Take 1 tablet (40 mg total) by mouth daily.   fluticasone 50 MCG/ACT nasal spray Commonly known as:  FLONASE Place 1 spray into both nostrils 2 (two) times daily as needed for allergies or rhinitis.   ibuprofen 600 MG tablet Commonly known as:  ADVIL,MOTRIN Take 1 tablet (600 mg total) by mouth 4 (four) times daily.          Objective:    BP 110/69   Pulse 92   Temp 98.2 F (36.8 C) (Oral)   Ht 5\' 2"  (1.575 m)   Wt 161 lb 12.8 oz (73.4 kg)   BMI 29.59 kg/m   No  Known Allergies  Wt Readings from Last 3 Encounters:  01/04/19 161 lb 12.8 oz (73.4 kg)  08/10/18 179 lb (81.2 kg)  07/01/18 188 lb 9.6 oz (85.5 kg)    Physical Exam Constitutional:      Appearance: She is well-developed.  HENT:     Head: Normocephalic and atraumatic.  Eyes:     Conjunctiva/sclera: Conjunctivae normal.     Pupils: Pupils are equal, round, and reactive to light.  Cardiovascular:     Rate and Rhythm: Normal rate and regular rhythm.     Heart sounds: Normal heart sounds.  Pulmonary:     Effort: Pulmonary effort is normal.     Breath sounds: Normal breath sounds.  Abdominal:     General: Bowel sounds are normal.     Palpations: Abdomen is soft.  Skin:    General: Skin is warm and dry.     Findings: No rash.  Neurological:     Mental Status: She is alert and oriented to person, place, and time.     Deep Tendon Reflexes: Reflexes are normal and symmetric.  Psychiatric:        Behavior: Behavior normal.        Thought Content: Thought content  normal.        Judgment: Judgment normal.         Assessment & Plan:   1. Episodic mood disorder (HCC) - citalopram (CELEXA) 40 MG tablet; Take 1 tablet (40 mg total) by mouth daily.  Dispense: 30 tablet; Refill: 5 - alprazolam (XANAX) 2 MG tablet; Take 0.5-1 tablets (1-2 mg total) by mouth 3 (three) times daily as needed for anxiety.  Dispense: 90 tablet; Refill: 5  2. Generalized anxiety disorder - citalopram (CELEXA) 40 MG tablet; Take 1 tablet (40 mg total) by mouth daily.  Dispense: 30 tablet; Refill: 5 - alprazolam (XANAX) 2 MG tablet; Take 0.5-1 tablets (1-2 mg total) by mouth 3 (three) times daily as needed for anxiety.  Dispense: 90 tablet; Refill: 5 - Tox[employer] 13 (MW), Urine; Future  Plan over the next 6 months is to take 1/2 tablet less per day possibly even up to a whole tablet per day prescription will be written as 1 to 2 mg 3 times a day as needed for anxiety.  Patient is willing to work on decreasing this.  Pain with flexion get  Continue all other maintenance medications as listed above.  Follow up plan: Return in about 6 months (around 07/05/2019) for recheck.  Educational handout given for survey  Remus Loffler PA-C [facility] [address]  [place], [place] [address] [phone]   01/04/2019, 2:55 PM

## 2019-02-08 ENCOUNTER — Other Ambulatory Visit: Payer: Self-pay | Admitting: Physician Assistant

## 2019-02-08 ENCOUNTER — Telehealth: Payer: Self-pay | Admitting: Physician Assistant

## 2019-02-08 MED ORDER — OSELTAMIVIR PHOSPHATE 75 MG PO CAPS: 75.0000 mg | ORAL_CAPSULE | Freq: Two times a day (BID) | ORAL | 0 refills | Status: DC

## 2019-02-08 NOTE — Telephone Encounter (Signed)
Faxed note to patient's employer at [phone]  Unable to reach patient to let them know.

## 2019-02-08 NOTE — Telephone Encounter (Signed)
No answer, no voicemail.

## 2019-02-08 NOTE — Telephone Encounter (Signed)
Patient aware and note faxed.  

## 2019-02-08 NOTE — Telephone Encounter (Signed)
What symptoms do you have? Running fever, body aches, no appetite, runny nose. Krista Thomas called in Tamiflu for her son Friday and she needs some called in  How long have you been sick? Saturday night  Have you been seen for this problem? NO  If your provider decides to give you a prescription, which pharmacy would you like for it to be sent to? [facility] in [place]   Patient informed that this information will be sent to the clinical staff for review and that they should receive a follow up call.

## 2019-02-08 NOTE — Telephone Encounter (Signed)
ok 

## 2019-02-08 NOTE — Telephone Encounter (Signed)
Fax Number [phone]  Pt is wanting to know if we can fax note to her work.

## 2019-02-08 NOTE — Telephone Encounter (Signed)
Medication sent to her pharmacy.

## 2019-02-08 NOTE — Telephone Encounter (Signed)
Patient aware. She wants to know if she can get a note for work. She is requesting a note for work for the next few days.

## 2019-02-10 ENCOUNTER — Telehealth: Payer: Self-pay | Admitting: Physician Assistant

## 2019-02-10 NOTE — Telephone Encounter (Signed)
Ok to do note that says she must be fever free for 24 hours before returning to work.  Okay to do from whatever the start note was, and then through tomorrow.    But put that other part on it too.

## 2019-02-10 NOTE — Telephone Encounter (Signed)
Letter written, printed and placed up front for pt to pick up. Pt is aware.

## 2019-02-10 NOTE — Telephone Encounter (Signed)
Patient aware provider will be back tomorrow and note will be addressed then.   She wants to thank the provider for all her help during this time.

## 2019-02-11 ENCOUNTER — Telehealth: Payer: Self-pay | Admitting: Physician Assistant

## 2019-02-11 NOTE — Telephone Encounter (Signed)
Write her a note through Saturday

## 2019-02-11 NOTE — Telephone Encounter (Signed)
Patient notified letter was completed.  Faxed it to [facility] per patient's request.

## 2019-03-10 ENCOUNTER — Other Ambulatory Visit: Payer: Self-pay

## 2019-03-10 ENCOUNTER — Ambulatory Visit (INDEPENDENT_AMBULATORY_CARE_PROVIDER_SITE_OTHER): Payer: Self-pay | Admitting: Family Medicine

## 2019-03-10 DIAGNOSIS — R6889 Other general symptoms and signs: Secondary | ICD-10-CM

## 2019-03-10 DIAGNOSIS — Z20822 Contact with and (suspected) exposure to covid-19: Secondary | ICD-10-CM

## 2019-03-10 DIAGNOSIS — Z7189 Other specified counseling: Secondary | ICD-10-CM

## 2019-03-10 MED ORDER — BENZONATATE 100 MG PO CAPS: 100.0000 mg | ORAL_CAPSULE | Freq: Three times a day (TID) | ORAL | 0 refills | Status: DC | PRN

## 2019-03-10 NOTE — Patient Instructions (Signed)
It appears that you have a viral upper respiratory infection (cold).  Cold symptoms can last up to 2 weeks.    - Get plenty of rest and drink plenty of fluids. - Try to breathe moist air. Use a cold mist humidifier. - Consume warm fluids (soup or tea) to provide relief for a stuffy nose and to loosen phlegm. - For nasal stuffiness, try saline nasal spray or a Neti Pot. Afrin nasal spray can also be used but this product should not be used longer than 3 days or it will cause rebound nasal stuffiness (worsening nasal congestion). - For sore throat pain relief: use chloraseptic spray, suck on throat lozenges, hard candy or popsicles; gargle with warm salt water (1/4 tsp. salt per 8 oz. of water); and eat soft, bland foods. - Eat a well-balanced diet. If you cannot, ensure you are getting enough nutrients by taking a daily multivitamin. - Avoid dairy products, as they can thicken phlegm. - Avoid alcohol, as it impairs your body's immune system.  CONTACT YOUR DOCTOR IF YOU EXPERIENCE ANY OF THE FOLLOWING: - High fever - Ear pain - Sinus-type headache - Unusually severe cold symptoms - Cough that gets worse while other cold symptoms improve - Flare up of any chronic lung problem, such as asthma - Your symptoms persist longer than 2 weeks      Person Under Monitoring Name: Krista Thomas  Location: [address] [place] [address]   Infection Prevention Recommendations for Individuals Confirmed to have, or Being Evaluated for, 2019 Novel Coronavirus (COVID-19) Infection Who Receive Care at Home  Individuals who are confirmed to have, or are being evaluated for, COVID-19 should follow the prevention steps below until a healthcare provider or local or state health department says they can return to normal activities.  Stay home except to get medical care You should restrict activities outside your home, except for getting medical care. Do not go to work, school, or public areas,  and do not use public transportation or taxis.  Call ahead before visiting your doctor Before your medical appointment, call the healthcare provider and tell them that you have, or are being evaluated for, COVID-19 infection. This will help the healthcare provider's office take steps to keep other people from getting infected. Ask your healthcare provider to call the local or state health department.  Monitor your symptoms Seek prompt medical attention if your illness is worsening (e.g., difficulty breathing). Before going to your medical appointment, call the healthcare provider and tell them that you have, or are being evaluated for, COVID-19 infection. Ask your healthcare provider to call the local or state health department.  Wear a facemask You should wear a facemask that covers your nose and mouth when you are in the same room with other people and when you visit a healthcare provider. People who live with or visit you should also wear a facemask while they are in the same room with you.  Separate yourself from other people in your home As much as possible, you should stay in a different room from other people in your home. Also, you should use a separate bathroom, if available.  Avoid sharing household items You should not share dishes, drinking glasses, cups, eating utensils, towels, bedding, or other items with other people in your home. After using these items, you should wash them thoroughly with soap and water.  Cover your coughs and sneezes Cover your mouth and nose with a tissue when you cough or  sneeze, or you can cough or sneeze into your sleeve. Throw used tissues in a lined trash can, and immediately wash your hands with soap and water for at least 20 seconds or use an alcohol-based hand rub.  Wash your [employer] your hands often and thoroughly with soap and water for at least 20 seconds. You can use an alcohol-based hand sanitizer if soap and water are not  available and if your hands are not visibly dirty. Avoid touching your eyes, nose, and mouth with unwashed hands.   Prevention Steps for Caregivers and Household Members of Individuals Confirmed to have, or Being Evaluated for, COVID-19 Infection Being Cared for in the Home  If you live with, or provide care at home for, a person confirmed to have, or being evaluated for, COVID-19 infection please follow these guidelines to prevent infection:  Follow healthcare provider's instructions Make sure that you understand and can help the patient follow any healthcare provider instructions for all care.  Provide for the patient's basic needs You should help the patient with basic needs in the home and provide support for getting groceries, prescriptions, and other personal needs.  Monitor the patient's symptoms If they are getting sicker, call his or her medical provider and tell them that the patient has, or is being evaluated for, COVID-19 infection. This will help the healthcare provider's office take steps to keep other people from getting infected. Ask the healthcare provider to call the local or state health department.  Limit the number of people who have contact with the patient  If possible, have only one caregiver for the patient.  Other household members should stay in another home or place of residence. If this is not possible, they should stay  in another room, or be separated from the patient as much as possible. Use a separate bathroom, if available.  Restrict visitors who do not have an essential need to be in the home.  Keep older adults, very young children, and other sick people away from the patient Keep older adults, very young children, and those who have compromised immune systems or chronic health conditions away from the patient. This includes people with chronic heart, lung, or kidney conditions, diabetes, and cancer.  Ensure good ventilation Make sure that  shared spaces in the home have good air flow, such as from an air conditioner or an opened window, weather permitting.  Wash your hands often  Wash your hands often and thoroughly with soap and water for at least 20 seconds. You can use an alcohol based hand sanitizer if soap and water are not available and if your hands are not visibly dirty.  Avoid touching your eyes, nose, and mouth with unwashed hands.  Use disposable paper towels to dry your hands. If not available, use dedicated cloth towels and replace them when they become wet.  Wear a facemask and gloves  Wear a disposable facemask at all times in the room and gloves when you touch or have contact with the patient's blood, body fluids, and/or secretions or excretions, such as sweat, saliva, sputum, nasal mucus, vomit, urine, or feces.  Ensure the mask fits over your nose and mouth tightly, and do not touch it during use.  Throw out disposable facemasks and gloves after using them. Do not reuse.  Wash your hands immediately after removing your facemask and gloves.  If your personal clothing becomes contaminated, carefully remove clothing and launder. Wash your hands after handling contaminated clothing.  Place all used  disposable facemasks, gloves, and other waste in a lined container before disposing them with other household waste.  Remove gloves and wash your hands immediately after handling these items.  Do not share dishes, glasses, or other household items with the patient  Avoid sharing household items. You should not share dishes, drinking glasses, cups, eating utensils, towels, bedding, or other items with a patient who is confirmed to have, or being evaluated for, COVID-19 infection.  After the person uses these items, you should wash them thoroughly with soap and water.  Wash laundry thoroughly  Immediately remove and wash clothes or bedding that have blood, body fluids, and/or secretions or excretions, such as  sweat, saliva, sputum, nasal mucus, vomit, urine, or feces, on them.  Wear gloves when handling laundry from the patient.  Read and follow directions on labels of laundry or clothing items and detergent. In general, wash and dry with the warmest temperatures recommended on the label.  Clean all areas the individual has used often  Clean all touchable surfaces, such as counters, tabletops, doorknobs, bathroom fixtures, toilets, phones, keyboards, tablets, and bedside tables, every day. Also, clean any surfaces that may have blood, body fluids, and/or secretions or excretions on them.  Wear gloves when cleaning surfaces the patient has come in contact with.  Use a diluted bleach solution (e.g., dilute bleach with 1 part bleach and 10 parts water) or a household disinfectant with a label that says EPA-registered for coronaviruses. To make a bleach solution at home, add 1 tablespoon of bleach to 1 quart (4 cups) of water. For a larger supply, add  cup of bleach to 1 gallon (16 cups) of water.  Read labels of cleaning products and follow recommendations provided on product labels. Labels contain instructions for safe and effective use of the cleaning product including precautions you should take when applying the product, such as wearing gloves or eye protection and making sure you have good ventilation during use of the product.  Remove gloves and wash hands immediately after cleaning.  Monitor yourself for signs and symptoms of illness Caregivers and household members are considered close contacts, should monitor their health, and will be asked to limit movement outside of the home to the extent possible. Follow the monitoring steps for close contacts listed on the symptom monitoring form.   ? If you have additional questions, contact your local health department or call the epidemiologist on call at [phone] (available 24/7). ? This guidance is subject to change. For the most up-to-date  guidance from [facility], please refer to their website: [email or web address]

## 2019-03-10 NOTE — Progress Notes (Signed)
Telephone visit  Subjective: CC: concern for COVID PCP: Remus Loffler, PA-C ZWC:HENIDP Ferra is a 53 y.o. female calls for telephone consult today. Patient provides verbal consent for consult held via phone.  Location of patient: home Location of provider: [facility] Others present for call: none  1. URI/ diarrhea Patient reports onset of nonbloody diarrhea on Thursday evening.  She has associated cough, congestion, rhinorrhea, fatigue and fever to 101 F.  She has been using Tylenol cold and cough with some improvement/control of fever.  She denies any hemoptysis.  She actually went to work on Friday with congestion and cough and was told that she had to work because she had no fever.  She has been so fatigued since then that she missed work and has been subsequently terminated, despite having tried to reach out to her employer multiple times for an excuse absence.  She has no known contact with COVID-19 but her grandchild is sick with very similar symptoms now.  She is hydrating without difficulty.   ROS: Per HPI  No Known Allergies Past Medical History:  Diagnosis Date  . Depression     Current Outpatient Medications:  .  alprazolam (XANAX) 2 MG tablet, Take 0.5-1 tablets (1-2 mg total) by mouth 3 (three) times daily as needed for anxiety., Disp: 90 tablet, Rfl: 5 .  citalopram (CELEXA) 40 MG tablet, Take 1 tablet (40 mg total) by mouth daily., Disp: 30 tablet, Rfl: 5 .  fluticasone (FLONASE) 50 MCG/ACT nasal spray, Place 1 spray into both nostrils 2 (two) times daily as needed for allergies or rhinitis., Disp: 16 g, Rfl: 6 .  ibuprofen (ADVIL,MOTRIN) 600 MG tablet, Take 1 tablet (600 mg total) by mouth 4 (four) times daily., Disp: 30 tablet, Rfl: 0  Assessment/ Plan: 53 y.o. female   1. Suspected Covid-19 Virus Infection Highly suspicious for COVID-19 infection.  I have advised her to quarantine.  Push oral fluids.  I have sent in [facility] for her to take 3 times daily if  needed for cough.  We discussed worrisome symptoms and signs that would warrant emergent evaluation emergency department and she was good understanding.  Additionally, the health department has been contacted with concerns of possible COVID 19 in this patient, as she does work at a nursing home. - benzonatate (TESSALON PERLES) 100 MG capsule; Take 1 capsule (100 mg total) by mouth 3 (three) times daily as needed for cough.  Dispense: 30 capsule; Refill: 0  2. Advice Given About Covid-19 Virus by Telephone As above   Start time: 4:49pm End time: 5:12pm  Total time spent on patient care (including telephone call/ virtual visit): 15 minutes  Jazzlene Huot Hulen Skains, DO [facility] [phone]

## 2019-03-15 ENCOUNTER — Telehealth: Payer: Self-pay

## 2019-03-15 NOTE — Telephone Encounter (Signed)
[facility] - Left Message  

## 2019-03-18 ENCOUNTER — Telehealth: Payer: Self-pay

## 2019-03-18 NOTE — Telephone Encounter (Signed)
2nd attempt - [facility]   

## 2019-03-19 ENCOUNTER — Telehealth: Payer: Self-pay

## 2019-03-19 DIAGNOSIS — F332 Major depressive disorder, recurrent severe without psychotic features: Secondary | ICD-10-CM

## 2019-03-19 DIAGNOSIS — F411 Generalized anxiety disorder: Secondary | ICD-10-CM
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
## 2019-03-23 ENCOUNTER — Telehealth: Payer: Self-pay

## 2019-03-23 NOTE — Telephone Encounter (Signed)
[facility] - Left Message  

## 2019-03-24 ENCOUNTER — Telehealth: Payer: Self-pay | Admitting: Physician Assistant

## 2019-03-24 NOTE — Telephone Encounter (Signed)
Returning call. Please advise. °
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
## 2019-03-24 NOTE — Telephone Encounter (Signed)
Attempted to contact patient - NVM 

## 2019-03-26 ENCOUNTER — Telehealth: Payer: Self-pay

## 2019-03-26 NOTE — Telephone Encounter (Signed)
PT is calling wanting to talk to AJ about her   citalopram (CELEXA) 40 MG tablet

## 2019-03-26 NOTE — Telephone Encounter (Signed)
Patient was wanting to discuss starting adderrall. Patient advised that she would have to be seen in order to discuss.

## 2019-03-26 NOTE — Telephone Encounter (Signed)
[facility] attempted to contact the patient in order to schedule an appt with her to see Dr. Vanetta Shawl.  Writer will continue to try and make contact with the patient.

## 2019-03-29 ENCOUNTER — Telehealth: Payer: Self-pay

## 2019-03-29 NOTE — Telephone Encounter (Signed)
[facility] - Patient has an appointment with Dr. Vanetta Shawl on Mar 31, 2019.
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
## 2019-03-31 ENCOUNTER — Other Ambulatory Visit: Payer: Self-pay

## 2019-03-31 ENCOUNTER — Encounter (HOSPITAL_COMMUNITY): Payer: Self-pay | Admitting: Psychiatry

## 2019-03-31 ENCOUNTER — Ambulatory Visit (INDEPENDENT_AMBULATORY_CARE_PROVIDER_SITE_OTHER): Payer: Self-pay | Admitting: Psychiatry

## 2019-03-31 DIAGNOSIS — F331 Major depressive disorder, recurrent, moderate: Secondary | ICD-10-CM

## 2019-03-31 DIAGNOSIS — F4310 Post-traumatic stress disorder, unspecified: Secondary | ICD-10-CM

## 2019-03-31 MED ORDER — BUPROPION HCL ER (XL) 150 MG PO TB24
150.0000 mg | ORAL_TABLET | Freq: Every day | ORAL | 0 refills | Status: DC
Start: 2019-03-31 — End: 2019-04-23

## 2019-03-31 NOTE — Patient Instructions (Addendum)
1. Start bupropion 150 mg daily  2. Continue citalopram 40 mg daily  3. Would advise to limit use of Xanax if able 4. Next appointment 6/1 at 4:30

## 2019-04-14 ENCOUNTER — Telehealth: Payer: Self-pay

## 2019-04-14 DIAGNOSIS — F332 Major depressive disorder, recurrent severe without psychotic features: Secondary | ICD-10-CM

## 2019-04-14 DIAGNOSIS — F411 Generalized anxiety disorder: Secondary | ICD-10-CM

## 2019-04-14 NOTE — Progress Notes (Signed)
The patient is seen by this Clinical research associate. She had adverse reaction from bupropion, which has been improving since she discontinued medication. Will discuss treatment options at the next visit.

## 2019-04-14 NOTE — BH Specialist Note (Signed)
[facility] Virtual BH Telephone Follow-up  MRN: [id number] NAME: Krista Thomas Date: 05/08/2019   Total time: 30 minutes Call number: 3/6  Reason for call today: Reason for Contact: PHQ9-4 weeks  PHQ-9 Scores:  Depression screen [facility] 2/9 05-08-2019 03/19/2019 01/04/2019 08/10/2018 07/01/2018  Decreased Interest 3 3 3 3 3   Down, Depressed, Hopeless 3 3 3 3 3   PHQ - 2 Score 6 6 6 6 6   Altered sleeping 2 3 3 3 3   Tired, decreased energy 3 2 3 3 3   Change in appetite 2 2 3 3 3   Feeling bad or failure about yourself  3 3 3 3 3   Trouble concentrating 2 2 3 3 3   Moving slowly or fidgety/restless 0 0 2 0 3  Suicidal thoughts 0 0 0 0 0  PHQ-9 Score 18 18 23 21 24   Difficult doing work/chores Very difficult Very difficult - - -  Some recent data might be hidden   GAD-7 Scores:  GAD 7 : Generalized Anxiety Score 05/08/2019 03/19/2019  Nervous, Anxious, on Edge 3 3  Control/stop worrying 3 3  Worry too much - different things 3 2  Trouble relaxing 2 2  Restless 1 1  Easily annoyed or irritable 1 2  Afraid - awful might happen 2 2  Total GAD 7 Score 15 15  Anxiety Difficulty Somewhat difficult -    Stress Current stressors: Current Stressors: (Negative side effects from the psychoatic medication. ) Sleep: Sleep: Difficulty falling asleep, Difficulty staying asleep Appetite: Appetite: Loss of appetite, Decreased Coping ability: Coping ability: Exhausted, Overwhelmed Patient taking medications as prescribed: Patient taking medications as prescribed: Yes  Current medications:  Outpatient Encounter Medications as of 05/08/2019  Medication Sig  . alprazolam (XANAX) 2 MG tablet Take 0.5-1 tablets (1-2 mg total) by mouth 3 (three) times daily as needed for anxiety.  . benzonatate (TESSALON PERLES) 100 MG capsule Take 1 capsule (100 mg total) by mouth 3 (three) times daily as needed for cough.  Marland Kitchen buPROPion (WELLBUTRIN XL) 150 MG 24 hr tablet Take 1 tablet (150 mg total) by mouth daily.  .  citalopram (CELEXA) 40 MG tablet Take 1 tablet (40 mg total) by mouth daily.  . fluticasone (FLONASE) 50 MCG/ACT nasal spray Place 1 spray into both nostrils 2 (two) times daily as needed for allergies or rhinitis.  Marland Kitchen ibuprofen (ADVIL,MOTRIN) 600 MG tablet Take 1 tablet (600 mg total) by mouth 4 (four) times daily.   No facility-administered encounter medications on file as of 05/08/19.      Self-harm Behaviors Risk Assessment Self-harm risk factors: Self-harm risk factors: (None Reported) Patient endorses recent thoughts of harming self: Have you recently had any thoughts about harming yourself?: No  [place] Suicide Severity Rating Scale: No flowsheet data found. C-SRSS 03/19/2019 May 08, 2019  1. Wish to be Dead No No  2. Suicidal Thoughts No No  6. Suicide Behavior Question No No     Danger to Others Risk Assessment Danger to others risk factors: Danger to Others Risk Factors: No risk factors noted Patient endorses recent thoughts of harming others: Notification required: No need or identified person    Substance Use Assessment Patient recently consumed alcohol:  None Reported  Alcohol Use Disorder Identification Test (AUDIT):  Alcohol Use Disorder Test (AUDIT) 03/19/2019 05-08-19  1. How often do you have a drink containing alcohol? 0 0  2. How many drinks containing alcohol do you have on a typical day when you are drinking? 0 0  3.  How often do you have six or more drinks on one occasion? 0 0  AUDIT-C Score 0 0  Alcohol Brief Interventions/Follow-up AUDIT Score <7 follow-up not indicated AUDIT Score <7 follow-up not indicated   Patient recently used drugs:  None Reported    Goals, Interventions and Follow-up Plan Goals: Increase healthy adjustment to current life circumstances and Improve medication compliance Interventions: Supportive Counseling and Medication Monitoring Follow-up Plan: [facility] Phone Follow UP   Summary:    Patient is a 53 year old female.  Patient PHQ  and GAD score remains the same.  Stressors / Medication:    Patient reports that she experienced a bad reaction to the change in her psychiatric medication.  Patient reports that her medication was changed on 03-31-2019.  Patient reports that she stopped taking Bupropion HCL 150mg  daily last Friday.   Patient reports that she has been experiencing racing thoughts of wanting to harm herself.  Patient denies a plan to harm herself or others.  Patient reports that she has been sleeping a lot.  Patient reports that since she stopped taking the medication, she has not experienced any more racing thoughts.  Patient reports that she is still not able to concentrate.  Patient reports that she does not have any energy to do much of anything.    Patient reports that she does not think that the Celexa is helping anymore but she is at the highest mg and the Celexa is the only medication that does not cause negative side effects.  Patient reports trying lots of other psychiatric medications in the past but she does not remember the names of the medications.   Writer discussed psychoeducation. Writer discussed sleep hygiene techniques.   Patient denies SI/HI/Psychosis/Substance Abuse. If your symptoms worsen or you have thoughts of suicide/homicide, PLEASE SEEK IMMEDIATE MEDICAL ATTENTION.  You may always call:  National Suicide Hotline: 8565926889(660)242-3130; [place] Crisis Line: [phone])[phone]; Crisis Recovery in [place]: 301-877-51665017619556.  These are available 24 hours a day, 7 days a week.    During the next session: Writer will follow up with the patient regarding psychiatric medication.   Phillip HealStevenson, Latosha Gaylord LaVerne, LCAS-A

## 2019-04-15 ENCOUNTER — Telehealth: Payer: Self-pay

## 2019-04-15 NOTE — Telephone Encounter (Signed)
Writer consulted with Dr. Vanetta Shawl ans the patient.   Patient will  has a follow up appointment with Dr. Vanetta Shawl on June 1st.   Writer informed the patient to hold bupropion at this time. Dr. Vanetta Shawl will discuss other treatment option during her April 26, 2019 appointment.   Dr. Vanetta Shawl wants to allow time for bupropion to be out of the patient system.   Patient declined OPT services and requested to continue [facility] brief therapy.   Patient denies a history of mania.  Patient denies a decreased need for sleep.  Patient denies euphoria.  Patient denies reports a past suicide attempt.  Patient denies any past or present self-injurious behaviors.  Patient denies a family history of mental illness.  Patient denies a family history of substance abuse.  Patient denies substance abuse.  Patient denies a family history of suicide. Patient denies DUI.   Krista Thomas
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
## 2019-04-22 ENCOUNTER — Telehealth: Payer: Self-pay | Admitting: Physician Assistant

## 2019-04-22 NOTE — Telephone Encounter (Signed)
Did you call patient?

## 2019-04-22 NOTE — Telephone Encounter (Signed)
No, I did not call.

## 2019-04-23 ENCOUNTER — Ambulatory Visit (INDEPENDENT_AMBULATORY_CARE_PROVIDER_SITE_OTHER): Payer: Self-pay | Admitting: Physician Assistant

## 2019-04-23 ENCOUNTER — Encounter: Payer: Self-pay | Admitting: Physician Assistant

## 2019-04-23 ENCOUNTER — Other Ambulatory Visit: Payer: Self-pay

## 2019-04-23 DIAGNOSIS — R41840 Attention and concentration deficit: Secondary | ICD-10-CM

## 2019-04-23 DIAGNOSIS — F331 Major depressive disorder, recurrent, moderate: Secondary | ICD-10-CM

## 2019-04-23 DIAGNOSIS — R5383 Other fatigue: Secondary | ICD-10-CM

## 2019-04-23 NOTE — Progress Notes (Signed)
        Telephone visit  Subjective: [id number] PCP: Remus Loffler, PA-C [id number] Krista Thomas is a 53 y.o. female calls for telephone consult today. Patient provides verbal consent for consult held via phone.  Patient is identified with 2 separate identifiers.  At this time the entire area is on COVID-19 social distancing and stay home orders are in place.  Patient is of higher risk and therefore we are performing this by a virtual method.  Location of patient: home Location of provider: [facility] Others present for call: no    Patient comes in with multiple complaints including extreme fatigue, difficulty with concentration, no ability to focus, not comprehending new material she needs to learn for work, cannot retain the material.  She states that she is so tired and has no energy for anything.  However she does work 9-hour shifts at a [employer].  Is extremely busy.  She is seeing psychiatry and talking to a therapist.  She reports that the therapy she does think is good.  I have asked her to try to reach out to them about possibly getting worked up for attention deficit.  We will forward this note to them.  I would like for her to come in and have her thyroid checked.  She is uninsured and has a hard time being able to pay for very much medical treatment.  But many of her symptoms are from the hypothyroid set of symptoms.  And she will try to get him to do this.  An order has been placed.    At this time work and also give her a note to be out for the next 3 days.  I have asked her to talk to her supervisor and see if there is a way for her to have shifts that her shoes are shorter in nature such as 4 to 6 hours at most.  The patient does also have a young toddler at home, this is the sign of her son that was murdered a couple years ago.  ROS: Per HPI  No Known Allergies Past Medical History:  Diagnosis Date  . Depression     Current Outpatient Medications:  .   alprazolam (XANAX) 2 MG tablet, Take 0.5-1 tablets (1-2 mg total) by mouth 3 (three) times daily as needed for anxiety., Disp: 90 tablet, Rfl: 5 .  benzonatate (TESSALON PERLES) 100 MG capsule, Take 1 capsule (100 mg total) by mouth 3 (three) times daily as needed for cough., Disp: 30 capsule, Rfl: 0 .  citalopram (CELEXA) 40 MG tablet, Take 1 tablet (40 mg total) by mouth daily., Disp: 30 tablet, Rfl: 5 .  fluticasone (FLONASE) 50 MCG/ACT nasal spray, Place 1 spray into both nostrils 2 (two) times daily as needed for allergies or rhinitis., Disp: 16 g, Rfl: 6 .  ibuprofen (ADVIL,MOTRIN) 600 MG tablet, Take 1 tablet (600 mg total) by mouth 4 (four) times daily., Disp: 30 tablet, Rfl: 0  Assessment/ Plan: 53 y.o. female   1. Moderate episode of recurrent major depressive disorder (HCC) Unable to tolerate bupropion Celexa 40 mg 1 daily  2. Poor concentration Discussed symptoms with therapist and psychiatrist - TSH; Future  3. Fatigue, unspecified type Discuss symptoms with therapist and psychiatrist - TSH; Future   Start time: 9:43 AM End time: 9:48 AM  No orders of the defined types were placed in this encounter.   Prudy Feeler PA-C [facility] [phone]

## 2019-04-23 NOTE — Addendum Note (Signed)
Addended by: Prescott Gum on: 04/23/2019 04:09 PM   Modules accepted: Orders

## 2019-04-24 LAB — TSH: TSH: 3.43 u[IU]/mL (ref 0.450–4.500)

## 2019-04-26 ENCOUNTER — Ambulatory Visit (INDEPENDENT_AMBULATORY_CARE_PROVIDER_SITE_OTHER): Payer: Self-pay | Admitting: Psychiatry

## 2019-04-26 ENCOUNTER — Other Ambulatory Visit: Payer: Self-pay

## 2019-04-26 ENCOUNTER — Encounter (HOSPITAL_COMMUNITY): Payer: Self-pay | Admitting: Psychiatry

## 2019-04-26 DIAGNOSIS — F331 Major depressive disorder, recurrent, moderate: Secondary | ICD-10-CM

## 2019-04-26 DIAGNOSIS — F4310 Post-traumatic stress disorder, unspecified: Secondary | ICD-10-CM

## 2019-04-26 MED ORDER — QUETIAPINE FUMARATE 25 MG PO TABS: 25.0000 mg | ORAL_TABLET | Freq: Every day | ORAL | 0 refills | Status: DC

## 2019-04-26 NOTE — Patient Instructions (Addendum)
1. Continue citalopram 40 mg daily  2. Start quetiapine 25 mg at night 3. (Continue Xanax 1-2 mg tabs three times a day for anxiety. Try to limits its use if able ) 4. Next appointment: 6/29 at 4 PM for 30 mins 5. Referral to therapy

## 2019-04-27 ENCOUNTER — Other Ambulatory Visit: Payer: Self-pay

## 2019-04-27 ENCOUNTER — Telehealth: Payer: Self-pay

## 2019-04-27 NOTE — Telephone Encounter (Signed)
[facility] - Left Message  

## 2019-04-30 ENCOUNTER — Telehealth: Payer: Self-pay

## 2019-04-30 NOTE — Telephone Encounter (Signed)
2nd attempt - [facility]   

## 2019-05-03 ENCOUNTER — Other Ambulatory Visit: Payer: Self-pay

## 2019-05-03 ENCOUNTER — Ambulatory Visit: Payer: Self-pay | Admitting: Physician Assistant

## 2019-05-03 ENCOUNTER — Encounter: Payer: Self-pay | Admitting: Physician Assistant

## 2019-05-03 DIAGNOSIS — F411 Generalized anxiety disorder: Secondary | ICD-10-CM

## 2019-05-03 DIAGNOSIS — F39 Unspecified mood [affective] disorder: Secondary | ICD-10-CM

## 2019-05-03 NOTE — Progress Notes (Signed)
8:27 am no answer 8:33 AM no answer 8:52 AM no answer

## 2019-05-05 ENCOUNTER — Telehealth: Payer: Self-pay

## 2019-05-05 NOTE — Telephone Encounter (Signed)
[facility] - Per Dr. Modesta Messing patient will see Maurice Small in the office. Writer will follow up with the patient

## 2019-05-11 ENCOUNTER — Telehealth: Payer: Self-pay

## 2019-05-11 NOTE — Telephone Encounter (Signed)
[facility] - Following up with the patient to ensure that she has scheduled an appt with Maurice Small.
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
## 2019-05-24 ENCOUNTER — Ambulatory Visit (HOSPITAL_COMMUNITY): Payer: Self-pay | Admitting: Psychiatry

## 2019-05-24 ENCOUNTER — Other Ambulatory Visit: Payer: Self-pay

## 2019-05-24 ENCOUNTER — Telehealth (HOSPITAL_COMMUNITY): Payer: Self-pay | Admitting: Psychiatry

## 2019-05-24 NOTE — Telephone Encounter (Signed)
Sent link for video visit through doxy, and the patient did not sign in. Called twice for appointment today; she does not answer the phone. There is no option to leave voice message.

## 2019-06-29 ENCOUNTER — Telehealth: Payer: Self-pay | Admitting: Physician Assistant

## 2019-06-29 ENCOUNTER — Other Ambulatory Visit: Payer: Self-pay

## 2019-06-29 ENCOUNTER — Ambulatory Visit (INDEPENDENT_AMBULATORY_CARE_PROVIDER_SITE_OTHER): Payer: Self-pay | Admitting: Family

## 2019-06-29 ENCOUNTER — Encounter: Payer: Self-pay | Admitting: Family

## 2019-06-29 DIAGNOSIS — R6889 Other general symptoms and signs: Secondary | ICD-10-CM

## 2019-06-29 DIAGNOSIS — Z20822 Contact with and (suspected) exposure to covid-19: Secondary | ICD-10-CM

## 2019-06-29 MED ORDER — BENZONATATE 200 MG PO CAPS: 200.0000 mg | ORAL_CAPSULE | Freq: Three times a day (TID) | ORAL | 1 refills | Status: DC | PRN

## 2019-06-29 MED ORDER — ALBUTEROL SULFATE HFA 108 (90 BASE) MCG/ACT IN AERS: 2.0000 | INHALATION_SPRAY | Freq: Four times a day (QID) | RESPIRATORY_TRACT | 0 refills | Status: AC | PRN

## 2019-06-29 NOTE — Telephone Encounter (Signed)
Note is already written. I do think she wants it faxed to her work. Does she have fax number?

## 2019-06-29 NOTE — Progress Notes (Signed)
Virtual Visit via telephone Note Due to COVID-19 pandemic this visit was conducted virtually. This visit type was conducted due to national recommendations for restrictions regarding the COVID-19 Pandemic (e.g. social distancing, sheltering in place) in an effort to limit this patient's exposure and mitigate transmission in our community. All issues noted in this document were discussed and addressed.  A physical exam was not performed with this format.  I connected with Krista LighterShelia Vanvoorhis on 06/29/19 at 10:07 AM by telephone and verified that I am speaking with the correct person using two identifiers. Krista Thomas is currently located at home  and grandson is currently with her during visit. The provider, Jannifer Rodneyhristy Anab Vivar, FNP is located in their office at time of visit.  I discussed the limitations, risks, security and privacy concerns of performing an evaluation and management service by telephone and the availability of in person appointments. I also discussed with the patient that there may be a patient responsible charge related to this service. The patient expressed understanding and agreed to proceed.   History and Present Illness:  Cough This is a new problem. Episode onset: Saturday. The problem has been waxing and waning. The cough is productive of sputum. Associated symptoms include chills, a fever, headaches, myalgias, nasal congestion, postnasal drip, shortness of breath and wheezing. Pertinent negatives include no ear congestion, ear pain or sore throat. Risk factors for lung disease include smoking/tobacco exposure. She has tried rest for the symptoms.  Fever  This is a new problem. The current episode started yesterday. The problem occurs constantly. Associated symptoms include coughing, headaches and wheezing. Pertinent negatives include no ear pain or sore throat. She has tried fluids and NSAIDs for the symptoms. The treatment provided mild relief.      Review of Systems   Constitutional: Positive for chills and fever.  HENT: Positive for postnasal drip. Negative for ear pain and sore throat.   Respiratory: Positive for cough, shortness of breath and wheezing.   Musculoskeletal: Positive for myalgias.  Neurological: Positive for headaches.  All other systems reviewed and are negative.    Observations/Objective: No SOB or distress noted   Assessment and Plan: 1. Suspected Covid-19 Virus Infection Self isolate until results return  Rest  Force fluids Tylenol as needed  Work note given  RTO if symptoms worsen or do not improve  - Novel Coronavirus, NAA ([facility]) - benzonatate (TESSALON) 200 MG capsule; Take 1 capsule (200 mg total) by mouth 3 (three) times daily as needed.  Dispense: 30 capsule; Refill: 1 - albuterol (VENTOLIN HFA) 108 (90 Base) MCG/ACT inhaler; Inhale 2 puffs into the lungs every 6 (six) hours as needed for wheezing or shortness of breath.  Dispense: 8 g; Refill: 0     I discussed the assessment and treatment plan with the patient. The patient was provided an opportunity to ask questions and all were answered. The patient agreed with the plan and demonstrated an understanding of the instructions.   The patient was advised to call back or seek an in-person evaluation if the symptoms worsen or if the condition fails to improve as anticipated.  The above assessment and management plan was discussed with the patient. The patient verbalized understanding of and has agreed to the management plan. Patient is aware to call the clinic if symptoms persist or worsen. Patient is aware when to return to the clinic for a follow-up visit. Patient educated on when it is appropriate to go to the emergency department.   Time call ended:  10:19 AM  I provided 12 minutes of non-face-to-face time during this encounter.    [name], FNP

## 2019-06-29 NOTE — Telephone Encounter (Signed)
Patient will call employer and see if someone can pick up. If not patient will call and have [place] mail it to her.

## 2019-06-30 LAB — NOVEL CORONAVIRUS, NAA: SARS-CoV-2, NAA: NOT DETECTED

## 2019-07-02 ENCOUNTER — Encounter (INDEPENDENT_AMBULATORY_CARE_PROVIDER_SITE_OTHER): Payer: Self-pay

## 2019-07-02 ENCOUNTER — Telehealth: Payer: Self-pay | Admitting: Physician Assistant

## 2019-07-02 NOTE — Telephone Encounter (Signed)
Patient aware she must do televisit

## 2019-07-02 NOTE — Telephone Encounter (Signed)
Unable to leave message due to full voice mail.   Can keep appointment is symptoms are completely gone.

## 2019-07-05 ENCOUNTER — Ambulatory Visit (INDEPENDENT_AMBULATORY_CARE_PROVIDER_SITE_OTHER): Payer: Self-pay | Admitting: Physician Assistant

## 2019-07-05 ENCOUNTER — Encounter: Payer: Self-pay | Admitting: Physician Assistant

## 2019-07-05 DIAGNOSIS — F39 Unspecified mood [affective] disorder: Secondary | ICD-10-CM

## 2019-07-05 DIAGNOSIS — F411 Generalized anxiety disorder: Secondary | ICD-10-CM

## 2019-07-05 MED ORDER — CITALOPRAM HYDROBROMIDE 40 MG PO TABS: 40.0000 mg | ORAL_TABLET | Freq: Every day | ORAL | 5 refills | Status: DC

## 2019-07-05 MED ORDER — ALPRAZOLAM 2 MG PO TABS
1.0000 mg | ORAL_TABLET | Freq: Three times a day (TID) | ORAL | 5 refills | Status: DC | PRN
Start: 2019-07-05 — End: 2019-12-29

## 2019-07-05 MED ORDER — QUETIAPINE FUMARATE 25 MG PO TABS: 25.0000 mg | ORAL_TABLET | Freq: Every day | ORAL | 5 refills | Status: DC

## 2019-07-05 NOTE — Progress Notes (Signed)
Lowering by 1/2 tab        Telephone visit  Subjective: YB:[id number] and anxiwty PCP: Terald Sleeper, PA-C DPO:EUMPNT [name] is a 53 y.o. female calls for telephone consult today. Patient provides verbal consent for consult held via phone.  Patient is identified with 2 separate identifiers.  At this time the entire area is on COVID-19 social distancing and stay home orders are in place.  Patient is of higher risk and therefore we are performing this by a virtual method.  Location of patient: home Location of provider: [facility] Others present for call: no  Is for follow-up on her chronic medical conditions which do include depression and chronic anxiety.  We have worked on getting her good and balance with her medication and she states that she is feeling quite good at this time.  She has not been able to go to counseling.  But she has a good friend that she has been able to talk to.  In addition we have discussed that we need to lower her alprazolam working to start with half a tablet per day for the next few months.  And they will work on lowering more after that.  She understands that our goal was to reduce it by 50%.   ROS: Per HPI  No Known Allergies Past Medical History:  Diagnosis Date  . Depression     Current Outpatient Medications:  .  albuterol (VENTOLIN HFA) 108 (90 Base) MCG/ACT inhaler, Inhale 2 puffs into the lungs every 6 (six) hours as needed for wheezing or shortness of breath., Disp: 8 g, Rfl: 0 .  alprazolam (XANAX) 2 MG tablet, Take 0.5-1 tablets (1-2 mg total) by mouth 3 (three) times daily as needed for anxiety., Disp: 75 tablet, Rfl: 5 .  benzonatate (TESSALON) 200 MG capsule, Take 1 capsule (200 mg total) by mouth 3 (three) times daily as needed., Disp: 30 capsule, Rfl: 1 .  citalopram (CELEXA) 40 MG tablet, Take 1 tablet (40 mg total) by mouth daily., Disp: 30 tablet, Rfl: 5 .  fluticasone (FLONASE) 50 MCG/ACT nasal spray, Place 1 spray into both nostrils 2  (two) times daily as needed for allergies or rhinitis., Disp: 16 g, Rfl: 6 .  ibuprofen (ADVIL,MOTRIN) 600 MG tablet, Take 1 tablet (600 mg total) by mouth 4 (four) times daily., Disp: 30 tablet, Rfl: 0 .  QUEtiapine (SEROQUEL) 25 MG tablet, Take 1-2 tablets (25-50 mg total) by mouth at bedtime., Disp: 60 tablet, Rfl: 5  Assessment/ Plan: 53 y.o. female   1. Episodic mood disorder (HCC) - alprazolam (XANAX) 2 MG tablet; Take 0.5-1 tablets (1-2 mg total) by mouth 3 (three) times daily as needed for anxiety.  Dispense: 75 tablet; Refill: 5 - citalopram (CELEXA) 40 MG tablet; Take 1 tablet (40 mg total) by mouth daily.  Dispense: 30 tablet; Refill: 5 - QUEtiapine (SEROQUEL) 25 MG tablet; Take 1-2 tablets (25-50 mg total) by mouth at bedtime.  Dispense: 60 tablet; Refill: 5  2. Generalized anxiety disorder - alprazolam (XANAX) 2 MG tablet; Take 0.5-1 tablets (1-2 mg total) by mouth 3 (three) times daily as needed for anxiety.  Dispense: 75 tablet; Refill: 5 - citalopram (CELEXA) 40 MG tablet; Take 1 tablet (40 mg total) by mouth daily.  Dispense: 30 tablet; Refill: 5   Return in about 6 months (around 01/05/2020) for recheck medications.  Continue all other maintenance medications as listed above.  Start time: 2:53 PM  End time: 3:06 PM  Meds ordered this encounter  Medications  . alprazolam (XANAX) 2 MG tablet    Sig: Take 0.5-1 tablets (1-2 mg total) by mouth 3 (three) times daily as needed for anxiety.    Dispense:  75 tablet    Refill:  5    Order Specific Question:   Supervising Provider    Answer:   Raliegh IpGOTTSCHALK, ASHLY M [[id number]]  . citalopram (CELEXA) 40 MG tablet    Sig: Take 1 tablet (40 mg total) by mouth daily.    Dispense:  30 tablet    Refill:  5    Order Specific Question:   Supervising Provider    Answer:   Raliegh IpGOTTSCHALK, ASHLY M [[id number]]  . QUEtiapine (SEROQUEL) 25 MG tablet    Sig: Take 1-2 tablets (25-50 mg total) by mouth at bedtime.    Dispense:  60 tablet     Refill:  5    Order Specific Question:   Supervising Provider    Answer:   Raliegh IpGOTTSCHALK, ASHLY M [[id number]]    Krista FeelerAngel Daryle Amis PA-C [facility] [phone]

## 2019-09-10 ENCOUNTER — Telehealth: Payer: Self-pay | Admitting: Physician Assistant

## 2019-09-10 NOTE — Telephone Encounter (Signed)
Patient would not give me any information.  She wants to speak with you only.

## 2019-09-13 NOTE — Telephone Encounter (Signed)
Called number on file, no voicemail available.  It is okay for her to leave a message through the nurse call pool

## 2019-12-29 ENCOUNTER — Encounter: Payer: Self-pay | Admitting: Physician Assistant

## 2019-12-29 ENCOUNTER — Ambulatory Visit (INDEPENDENT_AMBULATORY_CARE_PROVIDER_SITE_OTHER): Payer: Self-pay | Admitting: Physician Assistant

## 2019-12-29 DIAGNOSIS — F39 Unspecified mood [affective] disorder: Secondary | ICD-10-CM

## 2019-12-29 DIAGNOSIS — F411 Generalized anxiety disorder: Secondary | ICD-10-CM

## 2019-12-29 MED ORDER — QUETIAPINE FUMARATE 25 MG PO TABS: 25.0000 mg | ORAL_TABLET | Freq: Every day | ORAL | 5 refills | Status: AC

## 2019-12-29 MED ORDER — CITALOPRAM HYDROBROMIDE 40 MG PO TABS: 40.0000 mg | ORAL_TABLET | Freq: Every day | ORAL | 5 refills | Status: AC

## 2019-12-29 MED ORDER — ALPRAZOLAM 2 MG PO TABS: 1.0000 mg | ORAL_TABLET | Freq: Three times a day (TID) | ORAL | 2 refills | Status: DC | PRN

## 2019-12-29 NOTE — Progress Notes (Signed)
Telephone visit  Subjective: ZJ:[id number] and anxiety PCP: Terald Sleeper, PA-C FBP:ZWCHEN Frumkin is a 54 y.o. female calls for telephone consult today. Patient provides verbal consent for consult held via phone.  Patient is identified with 2 separate identifiers.  At this time the entire area is on COVID-19 social distancing and stay home orders are in place.  Patient is of higher risk and therefore we are performing this by a virtual method.  Location of patient: home Location of provider: HOME Others present for call: no  Still quite depressed and down and grieving over her son that was killed 4 years ago. She is still very sad and anxious. Encouraged to call [facility] for counseling. She needs therapy to work through the issues. She has a 6 year old grandson that she has custody of. She cannot hold down a job because of her depression and anxiety. Her distraction level is very high. She will be calling tham today.   ROS: Per HPI  No Known Allergies Past Medical History:  Diagnosis Date  . Depression     Current Outpatient Medications:  .  albuterol (VENTOLIN HFA) 108 (90 Base) MCG/ACT inhaler, Inhale 2 puffs into the lungs every 6 (six) hours as needed for wheezing or shortness of breath., Disp: 8 g, Rfl: 0 .  alprazolam (XANAX) 2 MG tablet, Take 0.5-1 tablets (1-2 mg total) by mouth 3 (three) times daily as needed for anxiety., Disp: 75 tablet, Rfl: 2 .  benzonatate (TESSALON) 200 MG capsule, Take 1 capsule (200 mg total) by mouth 3 (three) times daily as needed., Disp: 30 capsule, Rfl: 1 .  citalopram (CELEXA) 40 MG tablet, Take 1 tablet (40 mg total) by mouth daily., Disp: 30 tablet, Rfl: 5 .  fluticasone (FLONASE) 50 MCG/ACT nasal spray, Place 1 spray into both nostrils 2 (two) times daily as needed for allergies or rhinitis., Disp: 16 g, Rfl: 6 .  ibuprofen (ADVIL,MOTRIN) 600 MG tablet, Take 1 tablet (600 mg total) by mouth 4 (four) times daily., Disp: 30 tablet, Rfl:  0 .  QUEtiapine (SEROQUEL) 25 MG tablet, Take 1-2 tablets (25-50 mg total) by mouth at bedtime., Disp: 60 tablet, Rfl: 5  Assessment/ Plan: 54 y.o. female   1. Episodic mood disorder (HCC) - citalopram (CELEXA) 40 MG tablet; Take 1 tablet (40 mg total) by mouth daily.  Dispense: 30 tablet; Refill: 5 - alprazolam (XANAX) 2 MG tablet; Take 0.5-1 tablets (1-2 mg total) by mouth 3 (three) times daily as needed for anxiety.  Dispense: 75 tablet; Refill: 2 - QUEtiapine (SEROQUEL) 25 MG tablet; Take 1-2 tablets (25-50 mg total) by mouth at bedtime.  Dispense: 60 tablet; Refill: 5  2. Generalized anxiety disorder - citalopram (CELEXA) 40 MG tablet; Take 1 tablet (40 mg total) by mouth daily.  Dispense: 30 tablet; Refill: 5 - alprazolam (XANAX) 2 MG tablet; Take 0.5-1 tablets (1-2 mg total) by mouth 3 (three) times daily as needed for anxiety.  Dispense: 75 tablet; Refill: 2   Return in about 3 months (around 03/27/2020).  Continue all other maintenance medications as listed above.  Start time: 10:35 AM End time: 10:48 AM  Meds ordered this encounter  Medications  . citalopram (CELEXA) 40 MG tablet    Sig: Take 1 tablet (40 mg total) by mouth daily.    Dispense:  30 tablet    Refill:  5    Order Specific Question:   Supervising Provider    Answer:   Lajuana Ripple,  Rozell Searing [[id number]]  . alprazolam (XANAX) 2 MG tablet    Sig: Take 0.5-1 tablets (1-2 mg total) by mouth 3 (three) times daily as needed for anxiety.    Dispense:  75 tablet    Refill:  2    Order Specific Question:   Supervising Provider    Answer:   Raliegh Ip [[id number]]  . QUEtiapine (SEROQUEL) 25 MG tablet    Sig: Take 1-2 tablets (25-50 mg total) by mouth at bedtime.    Dispense:  60 tablet    Refill:  5    Order Specific Question:   Supervising Provider    Answer:   Raliegh Ip [[id number]]    Prudy Feeler PA-C [facility] [phone]

## 2020-03-21 ENCOUNTER — Other Ambulatory Visit: Payer: Self-pay

## 2020-03-21 ENCOUNTER — Encounter: Payer: Self-pay | Admitting: Family Medicine

## 2020-03-21 ENCOUNTER — Ambulatory Visit (INDEPENDENT_AMBULATORY_CARE_PROVIDER_SITE_OTHER): Payer: Self-pay | Admitting: Family Medicine

## 2020-03-21 VITALS — BP 111/62 | HR 92 | Temp 98.2°F | Ht 62.0 in | Wt 143.0 lb

## 2020-03-21 DIAGNOSIS — F411 Generalized anxiety disorder: Secondary | ICD-10-CM

## 2020-03-21 DIAGNOSIS — F4310 Post-traumatic stress disorder, unspecified: Secondary | ICD-10-CM

## 2020-03-21 DIAGNOSIS — Z79899 Other long term (current) drug therapy: Secondary | ICD-10-CM

## 2020-03-21 DIAGNOSIS — F331 Major depressive disorder, recurrent, moderate: Secondary | ICD-10-CM

## 2020-03-21 DIAGNOSIS — F39 Unspecified mood [affective] disorder: Secondary | ICD-10-CM

## 2020-03-21 DIAGNOSIS — Z7689 Persons encountering health services in other specified circumstances: Secondary | ICD-10-CM

## 2020-03-21 MED ORDER — ALPRAZOLAM 2 MG PO TABS: 1.0000 mg | ORAL_TABLET | Freq: Three times a day (TID) | ORAL | 1 refills | Status: AC | PRN

## 2020-03-21 NOTE — Progress Notes (Addendum)
Subjective: CC: gAD w/ panic, PCP: Krista Sleeper, PA-C Krista Thomas is a 54 y.o. female presenting to clinic today for:  1. GAD with panic Patient reports longstanding history of depressive anxiety symptoms.  Symptoms got acutely worse when she lost her son 4 years ago.  She notes that he was shot down.  She had quite a bit of posttraumatic stress after this occurred.  She continues to struggle with anxiety and depression on a daily basis despite treatment with Seroquel, Celexa and Xanax 3 times daily.  She notes the Seroquel she takes intermittently as she has not found especially helpful.  She has been evaluated by psychiatry in the past but did not find them helpful because she cannot get a good relationship with them.  She was not very trusting of the provider because she cannot understand him.  Apparently there was a language barrier.  She would be willing to see a new psychiatrist if it was affordable and if she had some eye they could speak clear [place].  Denies any excessive daytime sedation with the alprazolam.  No falls.  No change in memory.  She denies any alcohol use.  She takes care of her 68-1/2-year-old grandson, which was the child of the son that passed away.  She does note that seeing her grandson grow often reminds her of her son and causes some of the symptoms to manifest.  Last dose of alprazolam was about 1 day ago.   ROS: Per HPI  No Known Allergies Past Medical History:  Diagnosis Date  . Depression     Current Outpatient Medications:  .  albuterol (VENTOLIN HFA) 108 (90 Base) MCG/ACT inhaler, Inhale 2 puffs into the lungs every 6 (six) hours as needed for wheezing or shortness of breath., Disp: 8 g, Rfl: 0 .  alprazolam (XANAX) 2 MG tablet, Take 0.5-1 tablets (1-2 mg total) by mouth 3 (three) times daily as needed for anxiety., Disp: 75 tablet, Rfl: 2 .  benzonatate (TESSALON) 200 MG capsule, Take 1 capsule (200 mg total) by mouth 3 (three) times daily as  needed., Disp: 30 capsule, Rfl: 1 .  citalopram (CELEXA) 40 MG tablet, Take 1 tablet (40 mg total) by mouth daily., Disp: 30 tablet, Rfl: 5 .  fluticasone (FLONASE) 50 MCG/ACT nasal spray, Place 1 spray into both nostrils 2 (two) times daily as needed for allergies or rhinitis., Disp: 16 g, Rfl: 6 .  ibuprofen (ADVIL,MOTRIN) 600 MG tablet, Take 1 tablet (600 mg total) by mouth 4 (four) times daily., Disp: 30 tablet, Rfl: 0 .  QUEtiapine (SEROQUEL) 25 MG tablet, Take 1-2 tablets (25-50 mg total) by mouth at bedtime., Disp: 60 tablet, Rfl: 5 Social History   Socioeconomic History  . Marital status: Widowed    Spouse name: Not on file  . Number of children: Not on file  . Years of education: Not on file  . Highest education level: Not on file  Occupational History  . Not on file  Tobacco Use  . Smoking status: Current Every Day Smoker    Packs/day: 0.50    Types: Cigarettes  . Smokeless tobacco: Never Used  Substance and Sexual Activity  . Alcohol use: No  . Drug use: No  . Sexual activity: Yes  Other Topics Concern  . Not on file  Social History Narrative  . Not on file   Social Determinants of Health   Financial Resource Strain:   . Difficulty of Paying Living Expenses:   Food  Insecurity:   . Worried About Charity fundraiser in the Last Year:   . Arboriculturist in the Last Year:   Transportation Needs:   . Film/video editor (Medical):   Marland Kitchen Lack of Transportation (Non-Medical):   Physical Activity:   . Days of Exercise per Week:   . Minutes of Exercise per Session:   Stress:   . Feeling of Stress :   Social Connections:   . Frequency of Communication with Friends and Family:   . Frequency of Social Gatherings with Friends and Family:   . Attends Religious Services:   . Active Member of Clubs or Organizations:   . Attends Archivist Meetings:   Marland Kitchen Marital Status:   Intimate Partner Violence:   . Fear of Current or Ex-Partner:   . Emotionally Abused:     Marland Kitchen Physically Abused:   . Sexually Abused:    Family History  Problem Relation Age of Onset  . Atrial fibrillation Mother   . Hypotension Father   . Alcohol abuse Father     Objective: Office vital signs reviewed. BP 111/62   Pulse 92   Temp 98.2 F (36.8 C)   Ht _0  (1.575 m)   Wt 143 lb (64.9 kg)   LMP 03/21/2020   SpO2 98%   BMI 26.16 kg/m   Physical Examination:  General: Awake, alert, well nourished, nontoxic appearing. Intermittently tearful during exam. HEENT: Normal, sclera white, MMM Cardio: regular rate Pulmonary: normal WOB on room air. Extremities: warm, well perfused, No edema, cyanosis or clubbing; +2 pulses bilaterally Neuro: no tremor Psych: mood labile, patient intermittently tearful.  Eye contact fair.  Does not appear to be responding to internal stimuli. Depression screen [facility] 2/9 03/21/2020 12/29/2019 04/14/2019  Decreased Interest _1 Down, Depressed, Hopeless 3 - 3  PHQ - 2 Score _2 Altered sleeping _3 Tired, decreased energy _4 Change in appetite _5 Feeling bad or failure about yourself  _6 Trouble concentrating _7 Moving slowly or fidgety/restless 0 2 0  Suicidal thoughts 0 0 0  PHQ-9 Score _8 Difficult doing work/chores Very difficult Somewhat difficult Very difficult  Some recent data might be hidden   GAD 7 : Generalized Anxiety Score 03/21/2020 12/29/2019 04/14/2019 03/19/2019  Nervous, Anxious, on Edge _9 Control/stop worrying _10 Worry too much - different things _11 Trouble relaxing _12 Restless _13 Easily annoyed or irritable _14 Afraid - awful might happen _15 Total GAD 7 Score _16 Anxiety Difficulty Very difficult Very difficult Somewhat difficult -   Assessment/ Plan: 54 y.o. female   1. Generalized anxiety disorder Not well controlled despite triple therapy.  UDS and CSC ordered per office policy.  I have placed a referral to psychiatry.  I have  also recommended that patient enrol in patient assistance with [facility], as I do not want finances to be a barrier to good health care.  I do suspect she has a component of PTSD given the loss of her son.  I think she certainly deserves to be evaluated by psychiatry to maybe get on a better regimen that will better control her anxiety and depressive symptoms.  We discussed that I  would have to taper the benzodiazepine here and she seems somewhat reluctant to do that.  However, she was certainly willing to see a psychiatrist if this was affordable.  I certainly think she would benefit from counseling as well but unsure if she would be willing to do this.  She could not leave a urine but will return later this afternoon to try and urinate for [place]. - ToxASSURE Select 13 (MW), Urine - CMP14+EGFR - Ambulatory referral to Psychiatry  2. Episodic mood disorder ([place]) - ToxASSURE Select 13 (MW), Urine - Ambulatory referral to Psychiatry  3. Moderate episode of recurrent major depressive disorder (HCC) Continue Celexa.  Intermittent use of Seroquel so not sure that this is a useful medicine for patient. - ToxASSURE Select 13 (MW), Urine - Ambulatory referral to Psychiatry  4. Controlled substance agreement signed - ToxASSURE Select 13 (MW), Urine  5. Establishing care with new doctor, encounter for  6. Post traumatic stress disorder High suspicion.  Referral to psychiatry placed - Ambulatory referral to Psychiatry   No orders of the defined types were placed in this encounter.  No orders of the defined types were placed in this encounter.   Patient returned.  UDS obtained.   Meds ordered this encounter  Medications  . alprazolam (XANAX) 2 MG tablet    Sig: Take 0.5-1 tablets (1-2 mg total) by mouth 3 (three) times daily as needed for anxiety.    Dispense:  75 tablet    Refill:  1   The Narcotic Database has been reviewed.  There were no red flags.    Janora Norlander, DO [place] [phone]

## 2020-03-21 NOTE — Addendum Note (Signed)
Addended by: Raliegh Ip on: 03/21/2020 05:08 PM   Modules accepted: Orders

## 2020-03-21 NOTE — Patient Instructions (Signed)
We discussed the risks of Xanax.  Because you are on a high dose, I am going to give you a bridge supply and get you to a specialist to talk about continuing this dose.  Controlled Substance Guidelines:  1. You cannot get an early refill, even it is lost.  2. You cannot get controlled medications from any other doctor, unless it is the emergency department and related to a new problem or injury.  3. You cannot use alcohol, marijuana, cocaine or any other recreational drugs while using this medication. This is very dangerous.  4. You are willing to have your urine drug tested at each visit.  5. You will not drive while using this medication, because that can put yourself and others in serious danger of an accident. 6. If any medication is stolen, then there must be a police report to verify it, or it cannot be refilled.  7. I will not prescribe these medications for longer than 3 months.  8. You must bring your pill bottle to each visit.  9. You must use the same pharmacy for all refills for the medication, unless you clear it with me beforehand.  10. You cannot share or sell this medication.

## 2020-03-23 ENCOUNTER — Ambulatory Visit: Payer: Self-pay | Attending: Internal Medicine

## 2020-03-23 ENCOUNTER — Telehealth: Payer: Self-pay | Admitting: Family Medicine

## 2020-03-23 DIAGNOSIS — Z23 Encounter for immunization: Secondary | ICD-10-CM

## 2020-03-23 NOTE — Telephone Encounter (Signed)
Letter written for work for Tuesday. Left up front for patient pick up. Patient notified and verbalized understanding

## 2020-03-23 NOTE — Progress Notes (Signed)
   Covid-19 Vaccination Clinic  Name:  Krista Thomas    MRN: [id number] DOB: February 17, 1966  03/23/2020  Krista Thomas was observed post Covid-19 immunization for 15 minutes without incident. She was provided with Vaccine Information Sheet and instruction to access the V-Safe system.   Krista Thomas was instructed to call 911 with any severe reactions post vaccine: Marland Kitchen Difficulty breathing  . Swelling of face and throat  . A fast heartbeat  . A bad rash all over body  . Dizziness and weakness   Immunizations Administered    Name Date Dose VIS Date Route   Moderna COVID-19 Vaccine 03/23/2020  2:01 PM 0.5 mL 10/2019 Intramuscular   Manufacturer: Moderna   Lot: [id number]   NDC: 06015-615-37

## 2020-03-24 LAB — TOXASSURE SELECT 13 (MW), URINE

## 2020-03-27 ENCOUNTER — Encounter: Payer: Self-pay | Admitting: Family Medicine

## 2020-04-10 NOTE — Progress Notes (Signed)
Dismissal letter sent.

## 2020-04-17 ENCOUNTER — Other Ambulatory Visit: Payer: Self-pay | Admitting: Family Medicine

## 2020-04-17 DIAGNOSIS — F39 Unspecified mood [affective] disorder: Secondary | ICD-10-CM

## 2020-04-17 DIAGNOSIS — F411 Generalized anxiety disorder: Secondary | ICD-10-CM

## 2021-06-04 DIAGNOSIS — F331 Major depressive disorder, recurrent, moderate: Secondary | ICD-10-CM | POA: Diagnosis not present

## 2021-06-04 DIAGNOSIS — Z13220 Encounter for screening for lipoid disorders: Secondary | ICD-10-CM | POA: Diagnosis not present

## 2021-06-04 DIAGNOSIS — Z0000 Encounter for general adult medical examination without abnormal findings: Secondary | ICD-10-CM | POA: Diagnosis not present

## 2021-06-04 DIAGNOSIS — F418 Other specified anxiety disorders: Secondary | ICD-10-CM | POA: Diagnosis not present

## 2021-06-04 DIAGNOSIS — G4700 Insomnia, unspecified: Secondary | ICD-10-CM | POA: Diagnosis not present

## 2021-10-16 ENCOUNTER — Other Ambulatory Visit (HOSPITAL_COMMUNITY): Payer: Self-pay | Admitting: Family Medicine

## 2021-10-16 DIAGNOSIS — Z1231 Encounter for screening mammogram for malignant neoplasm of breast: Secondary | ICD-10-CM
